# Patient Record
Sex: Male | Born: 1975 | Race: White | Hispanic: No | Marital: Married | Smoking: Never smoker
Health system: Southern US, Community
[De-identification: ages and names within clinical notes are randomized; demographics above are authoritative.]

## PROBLEM LIST (undated history)

## (undated) DIAGNOSIS — M5126 Other intervertebral disc displacement, lumbar region: Secondary | ICD-10-CM

## (undated) HISTORY — PX: HERNIA REPAIR: SHX51

## (undated) HISTORY — PX: VASECTOMY: SHX75

## (undated) HISTORY — PX: OTHER SURGICAL HISTORY: SHX169

---

## 2013-04-14 ENCOUNTER — Emergency Department (HOSPITAL_COMMUNITY): Payer: BC Managed Care – PPO

## 2013-04-14 ENCOUNTER — Encounter (HOSPITAL_COMMUNITY): Payer: Self-pay | Admitting: *Deleted

## 2013-04-14 ENCOUNTER — Emergency Department (HOSPITAL_COMMUNITY)
Admission: EM | Admit: 2013-04-14 | Discharge: 2013-04-14 | Disposition: A | Discharge status: Home / Self Care | Payer: BC Managed Care – PPO | Attending: Emergency Medicine | Admitting: Emergency Medicine

## 2013-04-14 DIAGNOSIS — R079 Chest pain, unspecified: Secondary | ICD-10-CM | POA: Insufficient documentation

## 2013-04-14 LAB — COMPREHENSIVE METABOLIC PANEL
Albumin: 4.2 g/dL (ref 3.5–5.2)
BUN: 21 mg/dL (ref 6–23)
Calcium: 9.7 mg/dL (ref 8.4–10.5)
Creatinine, Ser: 1.11 mg/dL (ref 0.50–1.35)
GFR calc Af Amer: >90 mL/min (ref >90)
Glucose, Bld: 101 mg/dL — ABNORMAL HIGH (ref 70–99)
Total Protein: 7.2 g/dL (ref 6.0–8.3)

## 2013-04-14 LAB — CBC WITH DIFFERENTIAL/PLATELET
Basophils Relative: 0 % (ref 0–1)
Eosinophils Absolute: 0.2 10*3/uL (ref 0.0–0.7)
Eosinophils Relative: 3 % (ref 0–5)
HCT: 48.3 % (ref 39.0–52.0)
Hemoglobin: 16.5 g/dL (ref 13.0–17.0)
MCH: 30.1 pg (ref 26.0–34.0)
MCHC: 34.2 g/dL (ref 30.0–36.0)
MCV: 88.1 fL (ref 78.0–100.0)
Monocytes Absolute: 0.5 10*3/uL (ref 0.1–1.0)
Monocytes Relative: 7 % (ref 3–12)
Neutrophils Relative %: 58 % (ref 43–77)

## 2013-04-14 LAB — TROPONIN I: Troponin I: <0.3 ng/mL (ref <0.30)

## 2013-04-14 MED ORDER — OMEPRAZOLE 20 MG PO CPDR: 20.0000 mg | DELAYED_RELEASE_CAPSULE | Freq: Every day | ORAL | Status: DC

## 2013-04-14 MED ORDER — ALUM & MAG HYDROXIDE-SIMETH 200-200-20 MG/5ML PO SUSP
30.0000 mL | Freq: Once | ORAL | Status: AC
  Administered 2013-04-14: 30 mL via ORAL
  Filled 2013-04-14: qty 30

## 2013-04-14 NOTE — ED Notes (Signed)
C/o chest tightness and left arm pain today; previous episode of light headedness and felt like couldn't breathe earlier this wk

## 2013-04-14 NOTE — ED Notes (Signed)
MD at bedside. 

## 2013-04-14 NOTE — ED Notes (Signed)
Patient states earlier this week he had an episode of lightheadedness and shortness of breath. Patient states today around 1400 he began experiencing light arm pain and later in the afternoon he began having chest tightness, described as feeling like he was being squeezed. Patient states today's events followed a larger than normal lunch of "a big greasy burger". 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 Patient A&Ox4, NAD at this time.

## 2013-04-14 NOTE — ED Notes (Signed)
Patient transported to X-ray 

## 2013-04-14 NOTE — ED Provider Notes (Addendum)
History     CSN: [id number]  Arrival date & time 04/14/13  2004   First MD Initiated Contact with Patient 04/14/13 2016      Chief Complaint  Patient presents with  . Chest Pain    HPI Patient reports developing pain in his left arm today shortly after eating a large hamburger.  The pain in his left arm was persistent since around 1:30 or 2 PM today has been present for the past 7 hours.  2 hours ago he developed a sense of heaviness in his chest that will not go away.  No associated diaphoresis nausea vomiting.  No shortness of breath.  He reports he had similar symptoms however it was more a sensation of pressure in his chest with shortness of breath several days ago when he was in [place] right after he beat and some fried food.  He denies abdominal pain.  Symptoms are mild in severity at this time.  No history of asthma.  No difficulty breathing.  No pleuritic nature of his pain.  He has no history of hypertension, diabetes, hyperlipidemia, or family history of early heart disease.  He does not smoke cigarettes   No past medical history on file.  Past Surgical History  Procedure Laterality Date  . Hernia repair    . Arm surgery      No family history on file.  History  Substance Use Topics  . Smoking status: Never Smoker   . Smokeless tobacco: Not on file  . Alcohol Use: No      Review of Systems  All other systems reviewed and are negative.    Allergies  Review of patient's allergies indicates no known allergies.  Home Medications   Current Outpatient Rx  Name  Route  Sig  Dispense  Refill  . ibuprofen (ADVIL,MOTRIN) 200 MG tablet   Oral   Take 400 mg by mouth every 6 (six) hours as needed for pain.           BP 127/82  Pulse 70  Temp(Src) 98.1 F (36.7 C)  Resp 20  Ht 6\' 2"  (1.88 m)  Wt 195 lb (88.451 kg)  BMI 25.03 kg/m2  SpO2 99%  Physical Exam  Nursing note and vitals reviewed. Constitutional: He is oriented to person, place, and time. He  appears well-developed and well-nourished.  HENT:  Head: Normocephalic and atraumatic.  Eyes: EOM are normal.  Neck: Normal range of motion.  Cardiovascular: Normal rate, regular rhythm, normal heart sounds and intact distal pulses.   Pulmonary/Chest: Effort normal and breath sounds normal. No respiratory distress.  Abdominal: Soft. He exhibits no distension. There is no tenderness.  Genitourinary: Rectum normal.  Musculoskeletal: Normal range of motion.  Neurological: He is alert and oriented to person, place, and time.  Skin: Skin is warm and dry.  Psychiatric: He has a normal mood and affect. Judgment normal.    ED Course  Procedures (including critical care time)   Date: 04/14/2013  Rate: 73  Rhythm: normal sinus rhythm  QRS Axis: normal  Intervals: normal  ST/T Wave abnormalities: normal  Conduction Disutrbances: none  Narrative Interpretation:   Old EKG Reviewed: no prior ecg     Labs Reviewed  COMPREHENSIVE METABOLIC PANEL - Abnormal; Notable for the following:    Glucose, Bld 101 (*)    GFR calc non Af Amer 85 (*)    All other components within normal limits  CBC WITH DIFFERENTIAL  TROPONIN I   Dg Chest 2  View  04/14/2013   *RADIOLOGY REPORT*  Clinical Data: Chest pain shortness of breath  CHEST - 2 VIEW  Comparison: None.  Findings: Normal heart, mediastinal, and hilar contours.  Cardiac leads project over the chest.  The lungs are well expanded and clear.  No pleural effusion or pneumothorax.  No acute osseous abnormality.  IMPRESSION: No acute cardiopulmonary disease.   Original Report Authenticated By: Britta Mccreedy, M.D.   I personally reviewed the imaging tests through PACS system I reviewed available ER/hospitalization records through the EMR   1. Chest pain       MDM  Suspect more GI related pain. Doubt ACS.  EKG and troponin are normal  9:35 PM Patient is feeling better after Maalox.  Discharge home on Prilosec.  He needs to develop a  relationship with her primary care physician.  He understands to return to the ER for new or worsening symptoms        Lyanne Co, MD 04/14/13 2135  Lyanne Co, MD 04/22/13 [phone]

## 2013-04-14 NOTE — Progress Notes (Signed)
   CARE MANAGEMENT ED NOTE 04/14/2013  Patient:  Mitchell Mccullough, Mitchell Mccullough   Account Number:  [id number]  Date Initiated:  04/14/2013  Documentation initiated by:  Radford Pax  Subjective/Objective Assessment:   Patient presents to ED with chest tightness and left arm pain     Subjective/Objective Assessment Detail:     Action/Plan:   Action/Plan Detail:   Anticipated DC Date:       Status Recommendation to Physician:   Result of Recommendation:    Other ED Services  Consult Working Plan    DC Planning Services  Other  PCP issues    Choice offered to / List presented to:            Status of service:  Completed, signed off  ED Comments:   ED Comments Detail:  Patient listed as not having a pcp.  EDCM spoke to patient who confirmed taht he does not have a pcp.  Instructed patient to call number on the back of his insurance card to find a doctor who is within network.  No further needs at thid time.

## 2014-09-13 IMAGING — CR DG CHEST 2V
2 series · 2 of 2 positions shown · non-contrast
Comparison: None.

CLINICAL DATA: Chest pain shortness of breath

CHEST - 2 VIEW

[w chest pa]
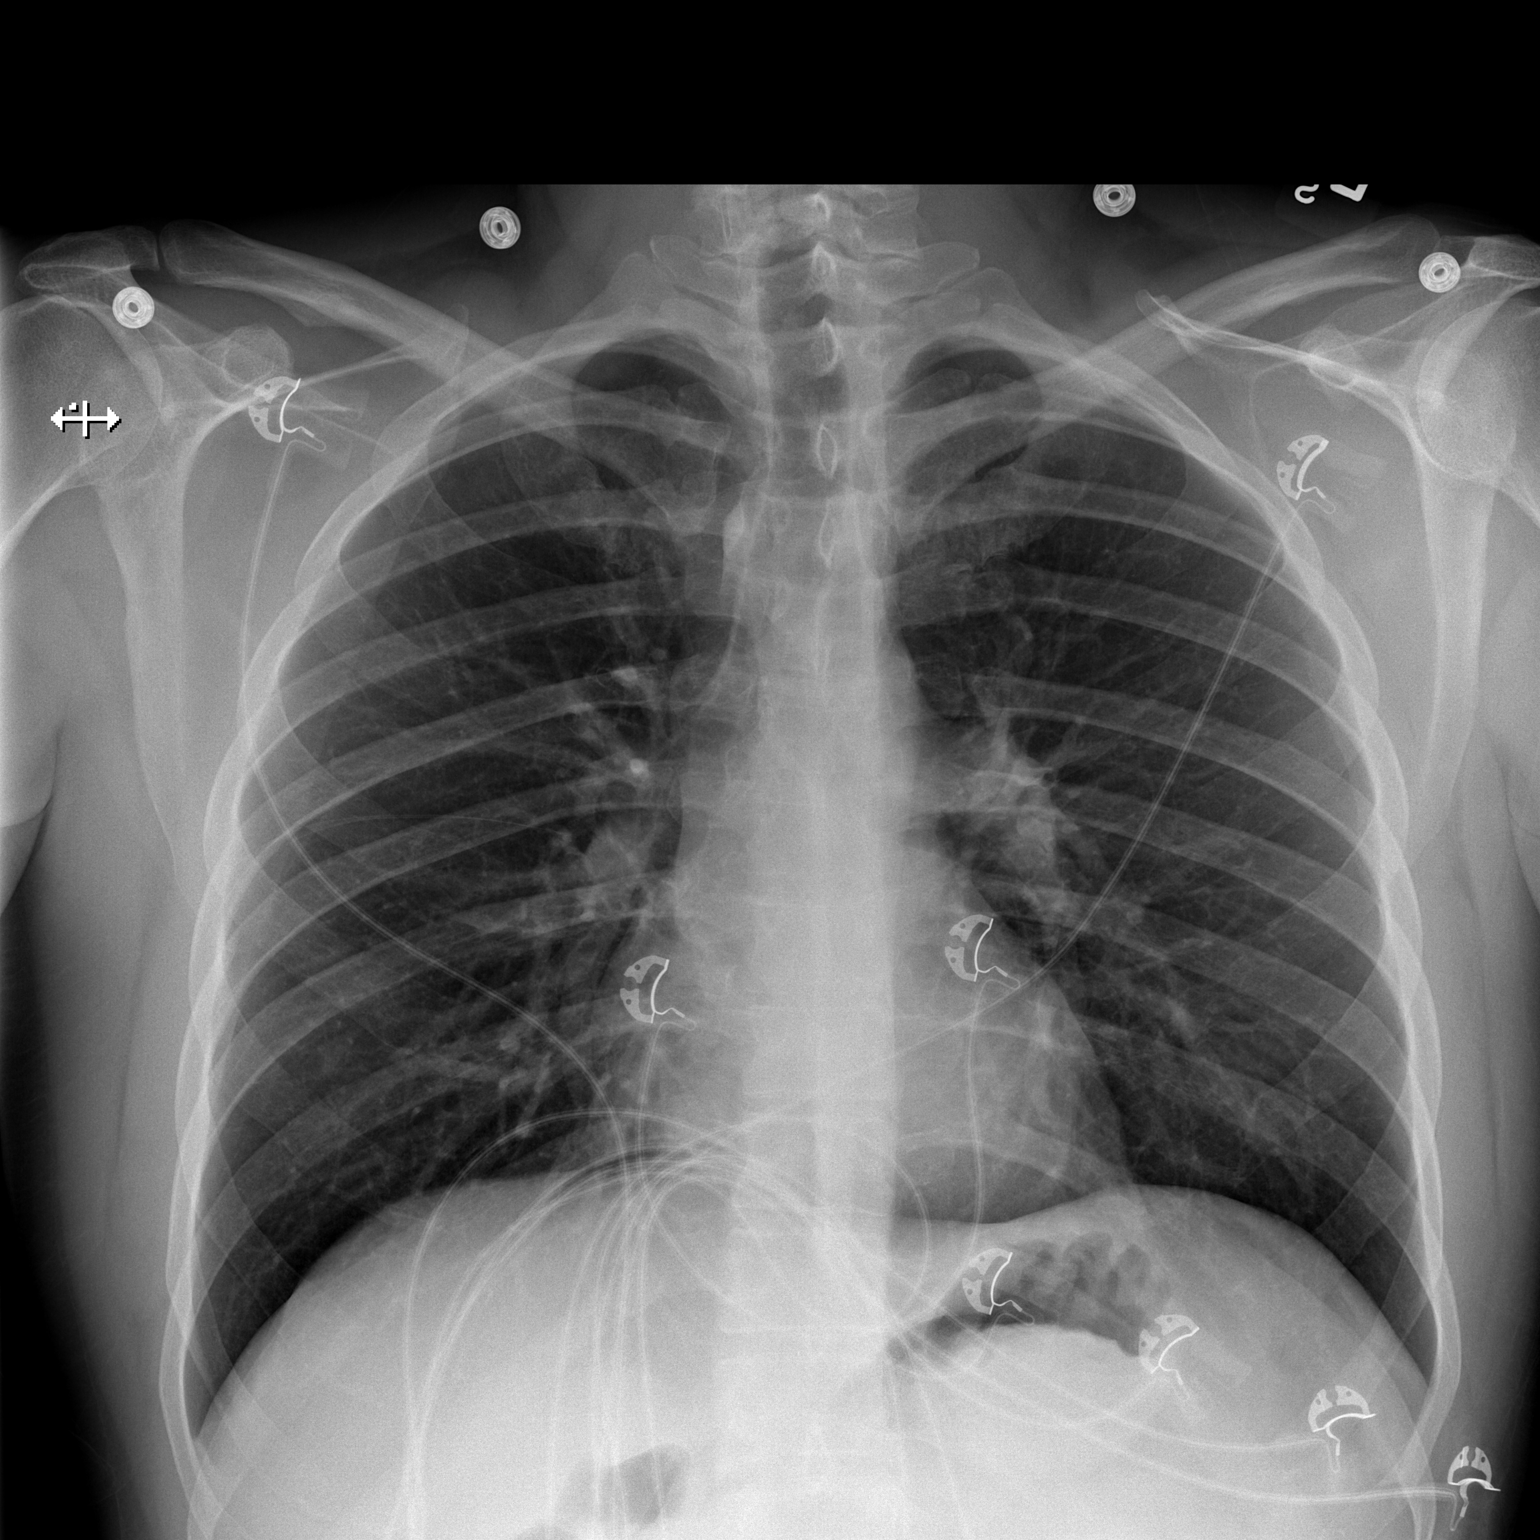

[w chest lat]
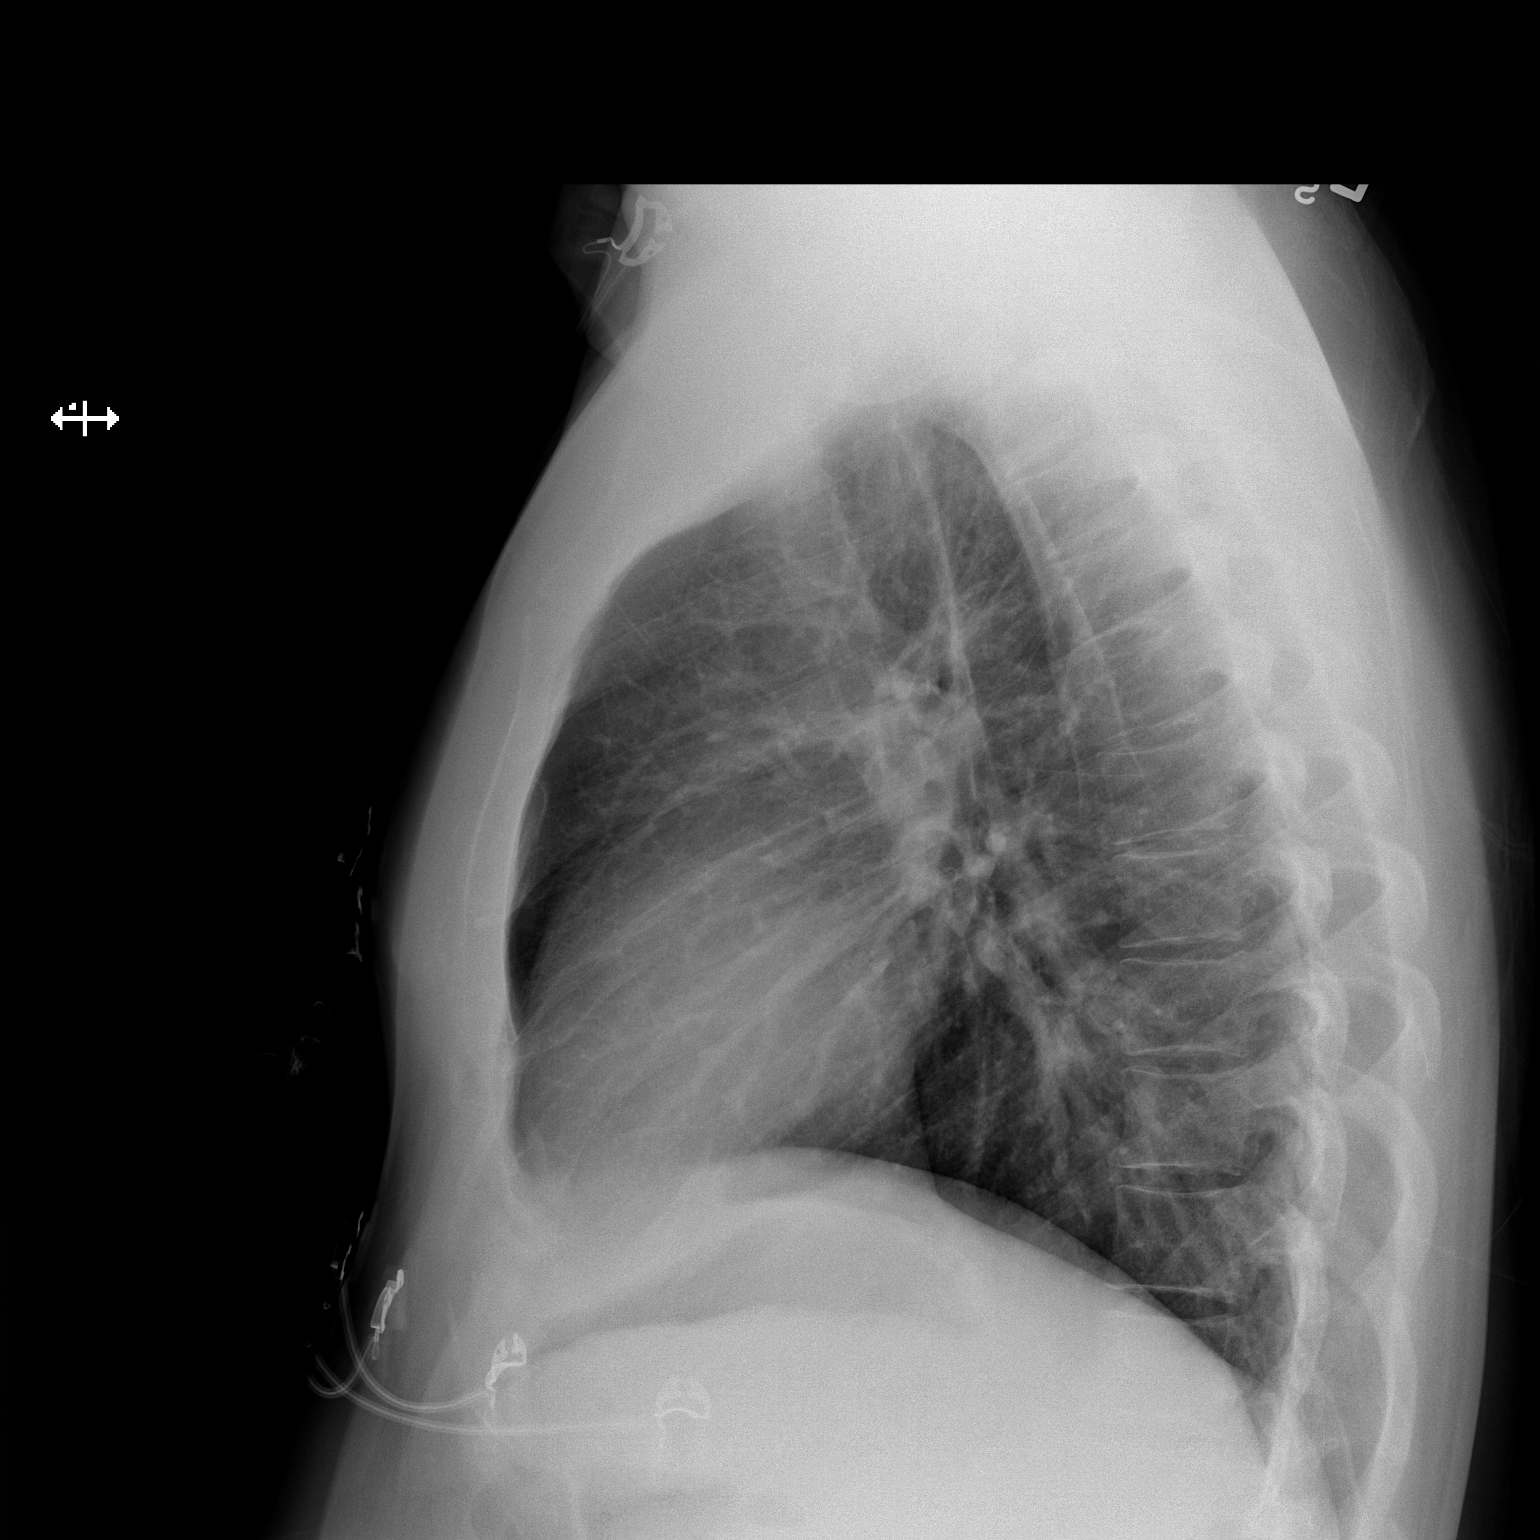

[2 of 2 positions shown; findings below may reference images not displayed]

FINDINGS: Normal heart, mediastinal, and hilar contours.  Cardiac
leads project over the chest.  The lungs are well expanded and
clear.  No pleural effusion or pneumothorax.  No acute osseous
abnormality.
IMPRESSION: No acute cardiopulmonary disease.

## 2017-07-14 ENCOUNTER — Ambulatory Visit: Payer: Self-pay | Admitting: Physician Assistant

## 2017-07-16 ENCOUNTER — Ambulatory Visit: Admit: 2017-07-16 | Payer: Self-pay | Admitting: Orthopedic Surgery

## 2017-07-16 SURGERY — LUMBAR LAMINECTOMY/DECOMPRESSION MICRODISCECTOMY 1 LEVEL
Anesthesia: General | Laterality: Left

## 2017-07-21 ENCOUNTER — Encounter (HOSPITAL_COMMUNITY): Payer: Self-pay | Admitting: *Deleted

## 2017-07-21 ENCOUNTER — Ambulatory Visit: Payer: Self-pay | Admitting: Physician Assistant

## 2017-07-21 NOTE — Progress Notes (Addendum)
Pt denies SOB, chest pain, and being under the care of a cardiologist. Pt denies having a stress test ,echo and cardiac cath. Pt denies having an EKG and chest x ray within the last year. Pt denies having recent labs. Pt made aware to stop taking  Aspirin, vitamins, fish oil, and herbal medications. Do not take any NSAIDs ie: Ibuprofen, Advil, Naproxen (Aleve), Motrin, BC and Goody Powder or any medication containing Aspirin. Pt verbalized understanding of all pre-op instructions.  Anesthesia made aware of order for consult.

## 2017-07-23 ENCOUNTER — Observation Stay (HOSPITAL_COMMUNITY)
Admission: RE | Admit: 2017-07-23 | Discharge: 2017-07-24 | Disposition: A | Discharge status: Home / Self Care | Payer: Self-pay | Source: Ambulatory Visit | Attending: Orthopedic Surgery | Admitting: Orthopedic Surgery

## 2017-07-23 ENCOUNTER — Ambulatory Visit (HOSPITAL_COMMUNITY): Payer: Self-pay | Admitting: Vascular Surgery

## 2017-07-23 ENCOUNTER — Ambulatory Visit (HOSPITAL_COMMUNITY)
Admission: RE | Disposition: A | Discharge status: Home / Self Care | Payer: Self-pay | Source: Ambulatory Visit | Attending: Orthopedic Surgery

## 2017-07-23 ENCOUNTER — Ambulatory Visit (HOSPITAL_COMMUNITY): Payer: Self-pay

## 2017-07-23 ENCOUNTER — Encounter (HOSPITAL_COMMUNITY): Payer: Self-pay | Admitting: Certified Registered Nurse Anesthetist

## 2017-07-23 DIAGNOSIS — G8929 Other chronic pain: Secondary | ICD-10-CM | POA: Insufficient documentation

## 2017-07-23 DIAGNOSIS — Z8249 Family history of ischemic heart disease and other diseases of the circulatory system: Secondary | ICD-10-CM | POA: Insufficient documentation

## 2017-07-23 DIAGNOSIS — M5137 Other intervertebral disc degeneration, lumbosacral region: Secondary | ICD-10-CM | POA: Insufficient documentation

## 2017-07-23 DIAGNOSIS — Z9889 Other specified postprocedural states: Secondary | ICD-10-CM

## 2017-07-23 DIAGNOSIS — M5126 Other intervertebral disc displacement, lumbar region: Secondary | ICD-10-CM | POA: Insufficient documentation

## 2017-07-23 DIAGNOSIS — M5417 Radiculopathy, lumbosacral region: Secondary | ICD-10-CM | POA: Insufficient documentation

## 2017-07-23 DIAGNOSIS — Z833 Family history of diabetes mellitus: Secondary | ICD-10-CM | POA: Insufficient documentation

## 2017-07-23 DIAGNOSIS — M5127 Other intervertebral disc displacement, lumbosacral region: Principal | ICD-10-CM | POA: Insufficient documentation

## 2017-07-23 DIAGNOSIS — Z79899 Other long term (current) drug therapy: Secondary | ICD-10-CM | POA: Insufficient documentation

## 2017-07-23 DIAGNOSIS — Z9852 Vasectomy status: Secondary | ICD-10-CM | POA: Insufficient documentation

## 2017-07-23 DIAGNOSIS — Z419 Encounter for procedure for purposes other than remedying health state, unspecified: Secondary | ICD-10-CM

## 2017-07-23 HISTORY — DX: Other intervertebral disc displacement, lumbar region: M51.26

## 2017-07-23 HISTORY — PX: LUMBAR LAMINECTOMY/DECOMPRESSION MICRODISCECTOMY: SHX5026

## 2017-07-23 LAB — CBC
HCT: 47.1 % (ref 39.0–52.0)
Hemoglobin: 16.2 g/dL (ref 13.0–17.0)
MCH: 30.9 pg (ref 26.0–34.0)
MCHC: 34.4 g/dL (ref 30.0–36.0)
MCV: 89.9 fL (ref 78.0–100.0)
PLATELETS: 175 10*3/uL (ref 150–400)
RBC: 5.24 MIL/uL (ref 4.22–5.81)
RDW: 12.7 % (ref 11.5–15.5)
WBC: 6.6 10*3/uL (ref 4.0–10.5)

## 2017-07-23 SURGERY — LUMBAR LAMINECTOMY/DECOMPRESSION MICRODISCECTOMY 1 LEVEL
Anesthesia: General | Site: Spine Lumbar | Laterality: Left

## 2017-07-23 MED ORDER — MENTHOL 3 MG MT LOZG: 1.0000 | LOZENGE | OROMUCOSAL | Status: DC | PRN

## 2017-07-23 MED ORDER — LACTATED RINGERS IV SOLN
INTRAVENOUS | Status: DC
  Administered 2017-07-23 (×3): via INTRAVENOUS

## 2017-07-23 MED ORDER — ACETAMINOPHEN 10 MG/ML IV SOLN
INTRAVENOUS | Status: AC
  Filled 2017-07-23: qty 100

## 2017-07-23 MED ORDER — PHENOL 1.4 % MT LIQD: 1.0000 | OROMUCOSAL | Status: DC | PRN

## 2017-07-23 MED ORDER — ROCURONIUM BROMIDE 100 MG/10ML IV SOLN
INTRAVENOUS | Status: DC | PRN
  Administered 2017-07-23 (×2): 10 mg via INTRAVENOUS
  Administered 2017-07-23: 50 mg via INTRAVENOUS

## 2017-07-23 MED ORDER — FENTANYL CITRATE (PF) 250 MCG/5ML IJ SOLN
INTRAMUSCULAR | Status: AC
  Filled 2017-07-23: qty 5

## 2017-07-23 MED ORDER — SODIUM CHLORIDE 0.9% FLUSH: 3.0000 mL | INTRAVENOUS | Status: DC | PRN

## 2017-07-23 MED ORDER — SUGAMMADEX SODIUM 200 MG/2ML IV SOLN
INTRAVENOUS | Status: DC | PRN
  Administered 2017-07-23: 200 mg via INTRAVENOUS

## 2017-07-23 MED ORDER — PHENYLEPHRINE 40 MCG/ML (10ML) SYRINGE FOR IV PUSH (FOR BLOOD PRESSURE SUPPORT)
PREFILLED_SYRINGE | INTRAVENOUS | Status: AC
  Filled 2017-07-23: qty 30

## 2017-07-23 MED ORDER — FENTANYL CITRATE (PF) 100 MCG/2ML IJ SOLN
INTRAMUSCULAR | Status: DC | PRN
  Administered 2017-07-23: 100 ug via INTRAVENOUS
  Administered 2017-07-23: 150 ug via INTRAVENOUS
  Administered 2017-07-23: 100 ug via INTRAVENOUS
  Administered 2017-07-23: 50 ug via INTRAVENOUS

## 2017-07-23 MED ORDER — THROMBIN 20000 UNITS EX SOLR
CUTANEOUS | Status: AC
  Filled 2017-07-23: qty 20000

## 2017-07-23 MED ORDER — 0.9 % SODIUM CHLORIDE (POUR BTL) OPTIME
TOPICAL | Status: DC | PRN
  Administered 2017-07-23 (×2): 1000 mL

## 2017-07-23 MED ORDER — LIDOCAINE HCL (CARDIAC) 20 MG/ML IV SOLN
INTRAVENOUS | Status: DC | PRN
  Administered 2017-07-23: 100 mg via INTRAVENOUS

## 2017-07-23 MED ORDER — OXYCODONE HCL 5 MG PO TABS
5.0000 mg | ORAL_TABLET | ORAL | Status: DC | PRN
  Administered 2017-07-23 – 2017-07-24 (×2): 10 mg via ORAL
  Filled 2017-07-23 (×3): qty 2

## 2017-07-23 MED ORDER — BUPIVACAINE-EPINEPHRINE 0.25% -1:200000 IJ SOLN
INTRAMUSCULAR | Status: DC | PRN
  Administered 2017-07-23: 10 mL

## 2017-07-23 MED ORDER — DEXAMETHASONE SODIUM PHOSPHATE 10 MG/ML IJ SOLN
INTRAMUSCULAR | Status: AC
  Filled 2017-07-23: qty 1

## 2017-07-23 MED ORDER — DEXTROSE 5 % IV SOLN
500.0000 mg | Freq: Four times a day (QID) | INTRAVENOUS | Status: DC | PRN
  Filled 2017-07-23: qty 5

## 2017-07-23 MED ORDER — FENTANYL CITRATE (PF) 100 MCG/2ML IJ SOLN
INTRAMUSCULAR | Status: AC
  Filled 2017-07-23: qty 2

## 2017-07-23 MED ORDER — ONDANSETRON HCL 4 MG/2ML IJ SOLN
INTRAMUSCULAR | Status: AC
  Filled 2017-07-23: qty 2

## 2017-07-23 MED ORDER — MIDAZOLAM HCL 2 MG/2ML IJ SOLN
INTRAMUSCULAR | Status: AC
  Filled 2017-07-23: qty 2

## 2017-07-23 MED ORDER — METHOCARBAMOL 500 MG PO TABS
ORAL_TABLET | ORAL | Status: AC
  Filled 2017-07-23: qty 1

## 2017-07-23 MED ORDER — MEPERIDINE HCL 25 MG/ML IJ SOLN: 6.2500 mg | INTRAMUSCULAR | Status: DC | PRN

## 2017-07-23 MED ORDER — OXYCODONE-ACETAMINOPHEN 10-325 MG PO TABS: 1.0000 | ORAL_TABLET | ORAL | 0 refills | Status: AC | PRN

## 2017-07-23 MED ORDER — HYDROXYZINE HCL 50 MG/ML IM SOLN
50.0000 mg | Freq: Four times a day (QID) | INTRAMUSCULAR | Status: DC | PRN
  Filled 2017-07-23: qty 1

## 2017-07-23 MED ORDER — BUPIVACAINE-EPINEPHRINE (PF) 0.25% -1:200000 IJ SOLN
INTRAMUSCULAR | Status: AC
  Filled 2017-07-23: qty 30

## 2017-07-23 MED ORDER — SUGAMMADEX SODIUM 500 MG/5ML IV SOLN
INTRAVENOUS | Status: AC
  Filled 2017-07-23: qty 5

## 2017-07-23 MED ORDER — ACETAMINOPHEN 10 MG/ML IV SOLN
INTRAVENOUS | Status: DC | PRN
  Administered 2017-07-23: 1000 mg via INTRAVENOUS

## 2017-07-23 MED ORDER — MIDAZOLAM HCL 5 MG/5ML IJ SOLN
INTRAMUSCULAR | Status: DC | PRN
  Administered 2017-07-23: 2 mg via INTRAVENOUS

## 2017-07-23 MED ORDER — FENTANYL CITRATE (PF) 100 MCG/2ML IJ SOLN
25.0000 ug | INTRAMUSCULAR | Status: DC | PRN
  Administered 2017-07-23 (×3): 50 ug via INTRAVENOUS

## 2017-07-23 MED ORDER — ONDANSETRON HCL 4 MG/2ML IJ SOLN: 4.0000 mg | Freq: Four times a day (QID) | INTRAMUSCULAR | Status: DC | PRN

## 2017-07-23 MED ORDER — ONDANSETRON 4 MG PO TBDP: 4.0000 mg | ORAL_TABLET | Freq: Three times a day (TID) | ORAL | 0 refills | Status: AC | PRN

## 2017-07-23 MED ORDER — ONDANSETRON HCL 4 MG/2ML IJ SOLN
INTRAMUSCULAR | Status: DC | PRN
  Administered 2017-07-23: 4 mg via INTRAVENOUS

## 2017-07-23 MED ORDER — ONDANSETRON HCL 4 MG PO TABS: 4.0000 mg | ORAL_TABLET | Freq: Four times a day (QID) | ORAL | Status: DC | PRN

## 2017-07-23 MED ORDER — DOCUSATE SODIUM 100 MG PO CAPS
100.0000 mg | ORAL_CAPSULE | Freq: Two times a day (BID) | ORAL | Status: DC
Start: 2017-07-23 — End: 2017-07-24
  Administered 2017-07-23 – 2017-07-24 (×2): 100 mg via ORAL
  Filled 2017-07-23 (×2): qty 1

## 2017-07-23 MED ORDER — THROMBIN 20000 UNITS EX KIT
PACK | CUTANEOUS | Status: DC | PRN
  Administered 2017-07-23: 20 mL via TOPICAL

## 2017-07-23 MED ORDER — METHOCARBAMOL 500 MG PO TABS: 500.0000 mg | ORAL_TABLET | Freq: Three times a day (TID) | ORAL | 0 refills | Status: AC

## 2017-07-23 MED ORDER — ACETAMINOPHEN 325 MG PO TABS
650.0000 mg | ORAL_TABLET | ORAL | Status: DC | PRN
  Filled 2017-07-23: qty 2

## 2017-07-23 MED ORDER — HEMOSTATIC AGENTS (NO CHARGE) OPTIME
TOPICAL | Status: DC | PRN
  Administered 2017-07-23 (×2): 1 via TOPICAL

## 2017-07-23 MED ORDER — LACTATED RINGERS IV SOLN: INTRAVENOUS | Status: DC

## 2017-07-23 MED ORDER — FENTANYL CITRATE (PF) 100 MCG/2ML IJ SOLN
INTRAMUSCULAR | Status: AC
  Administered 2017-07-23: 50 ug via INTRAVENOUS
  Filled 2017-07-23: qty 2

## 2017-07-23 MED ORDER — DEXAMETHASONE SODIUM PHOSPHATE 4 MG/ML IJ SOLN
INTRAMUSCULAR | Status: DC | PRN
  Administered 2017-07-23: 10 mg via INTRAVENOUS

## 2017-07-23 MED ORDER — POLYETHYLENE GLYCOL 3350 17 G PO PACK
17.0000 g | PACK | Freq: Every day | ORAL | Status: DC | PRN
  Filled 2017-07-23: qty 1

## 2017-07-23 MED ORDER — ONDANSETRON HCL 4 MG/2ML IJ SOLN: 4.0000 mg | Freq: Once | INTRAMUSCULAR | Status: DC | PRN

## 2017-07-23 MED ORDER — METHYLPREDNISOLONE ACETATE 40 MG/ML IJ SUSP
INTRAMUSCULAR | Status: AC
  Filled 2017-07-23: qty 1

## 2017-07-23 MED ORDER — SODIUM CHLORIDE 0.9% FLUSH: 3.0000 mL | Freq: Two times a day (BID) | INTRAVENOUS | Status: DC

## 2017-07-23 MED ORDER — CEFAZOLIN SODIUM-DEXTROSE 2-4 GM/100ML-% IV SOLN
2.0000 g | INTRAVENOUS | Status: AC
  Administered 2017-07-23: 2 g via INTRAVENOUS

## 2017-07-23 MED ORDER — PROPOFOL 10 MG/ML IV BOLUS
INTRAVENOUS | Status: AC
  Filled 2017-07-23: qty 20

## 2017-07-23 MED ORDER — MAGNESIUM CITRATE PO SOLN: 1.0000 | Freq: Once | ORAL | Status: DC | PRN

## 2017-07-23 MED ORDER — CEFAZOLIN SODIUM-DEXTROSE 2-4 GM/100ML-% IV SOLN
2.0000 g | Freq: Three times a day (TID) | INTRAVENOUS | Status: AC
  Administered 2017-07-23 – 2017-07-24 (×2): 2 g via INTRAVENOUS
  Filled 2017-07-23 (×2): qty 100

## 2017-07-23 MED ORDER — METHYLPREDNISOLONE ACETATE 40 MG/ML IJ SUSP
INTRAMUSCULAR | Status: DC | PRN
  Administered 2017-07-23: 40 mg

## 2017-07-23 MED ORDER — LIDOCAINE 2% (20 MG/ML) 5 ML SYRINGE
INTRAMUSCULAR | Status: AC
  Filled 2017-07-23: qty 5

## 2017-07-23 MED ORDER — ACETAMINOPHEN 650 MG RE SUPP: 650.0000 mg | RECTAL | Status: DC | PRN

## 2017-07-23 MED ORDER — OXYCODONE HCL 5 MG PO TABS
ORAL_TABLET | ORAL | Status: AC
  Filled 2017-07-23: qty 2

## 2017-07-23 MED ORDER — GABAPENTIN 300 MG PO CAPS
300.0000 mg | ORAL_CAPSULE | Freq: Three times a day (TID) | ORAL | Status: DC | PRN
Start: 2017-07-23 — End: 2017-07-24

## 2017-07-23 MED ORDER — PHENYLEPHRINE 40 MCG/ML (10ML) SYRINGE FOR IV PUSH (FOR BLOOD PRESSURE SUPPORT)
PREFILLED_SYRINGE | INTRAVENOUS | Status: AC
  Filled 2017-07-23: qty 10

## 2017-07-23 MED ORDER — MORPHINE SULFATE (PF) 4 MG/ML IV SOLN: 2.0000 mg | INTRAVENOUS | Status: DC | PRN

## 2017-07-23 MED ORDER — CEFAZOLIN SODIUM-DEXTROSE 2-4 GM/100ML-% IV SOLN
INTRAVENOUS | Status: AC
  Filled 2017-07-23: qty 100

## 2017-07-23 MED ORDER — ROCURONIUM BROMIDE 10 MG/ML (PF) SYRINGE
PREFILLED_SYRINGE | INTRAVENOUS | Status: AC
  Filled 2017-07-23: qty 5

## 2017-07-23 MED ORDER — METHOCARBAMOL 500 MG PO TABS
500.0000 mg | ORAL_TABLET | Freq: Four times a day (QID) | ORAL | Status: DC | PRN
  Administered 2017-07-23 – 2017-07-24 (×3): 500 mg via ORAL
  Filled 2017-07-23 (×4): qty 1

## 2017-07-23 MED ORDER — PROPOFOL 10 MG/ML IV BOLUS
INTRAVENOUS | Status: DC | PRN
  Administered 2017-07-23: 200 mg via INTRAVENOUS

## 2017-07-23 SURGICAL SUPPLY — 71 items
BNDG GAUZE ELAST 4 BULKY (GAUZE/BANDAGES/DRESSINGS) ×3 IMPLANT
BUR EGG ELITE 4.0 (BURR) IMPLANT
BUR EGG ELITE 4.0MM (BURR)
BUR MATCHSTICK NEURO 3.0 LAGG (BURR) IMPLANT
CANISTER SUCT 3000ML PPV (MISCELLANEOUS) ×3 IMPLANT
CLOSURE STERI-STRIP 1/2X4 (GAUZE/BANDAGES/DRESSINGS) ×1
CLSR STERI-STRIP ANTIMIC 1/2X4 (GAUZE/BANDAGES/DRESSINGS) ×2 IMPLANT
CORD BI POLAR (MISCELLANEOUS) ×3 IMPLANT
COVER SURGICAL LIGHT HANDLE (MISCELLANEOUS) ×3 IMPLANT
DRAIN CHANNEL 15F RND FF W/TCR (WOUND CARE) IMPLANT
DRAPE POUCH INSTRU U-SHP 10X18 (DRAPES) ×3 IMPLANT
DRAPE SURG 17X23 STRL (DRAPES) ×3 IMPLANT
DRAPE U-SHAPE 47X51 STRL (DRAPES) ×3 IMPLANT
DRSG AQUACEL AG ADV 3.5X 4 (GAUZE/BANDAGES/DRESSINGS) IMPLANT
DRSG AQUACEL AG ADV 3.5X 6 (GAUZE/BANDAGES/DRESSINGS) IMPLANT
DRSG OPSITE POSTOP 4X6 (GAUZE/BANDAGES/DRESSINGS) ×3 IMPLANT
DURAPREP 26ML APPLICATOR (WOUND CARE) ×3 IMPLANT
ELECT BLADE 4.0 EZ CLEAN MEGAD (MISCELLANEOUS) ×3
ELECT CAUTERY BLADE 6.4 (BLADE) ×3 IMPLANT
ELECT PENCIL ROCKER SW 15FT (MISCELLANEOUS) ×3 IMPLANT
ELECT REM PT RETURN 9FT ADLT (ELECTROSURGICAL) ×3
ELECTRODE BLDE 4.0 EZ CLN MEGD (MISCELLANEOUS) ×1 IMPLANT
ELECTRODE REM PT RTRN 9FT ADLT (ELECTROSURGICAL) ×1 IMPLANT
EVACUATOR SILICONE 100CC (DRAIN) IMPLANT
GLOVE BIO SURGEON STRL SZ 6.5 (GLOVE) ×2 IMPLANT
GLOVE BIO SURGEONS STRL SZ 6.5 (GLOVE) ×1
GLOVE BIOGEL PI IND STRL 6.5 (GLOVE) ×1 IMPLANT
GLOVE BIOGEL PI IND STRL 8.5 (GLOVE) ×1 IMPLANT
GLOVE BIOGEL PI INDICATOR 6.5 (GLOVE) ×2
GLOVE BIOGEL PI INDICATOR 8.5 (GLOVE) ×2
GLOVE SS BIOGEL STRL SZ 8.5 (GLOVE) ×1 IMPLANT
GLOVE SUPERSENSE BIOGEL SZ 8.5 (GLOVE) ×2
GOWN STRL REUS W/TWL 2XL LVL3 (GOWN DISPOSABLE) ×6 IMPLANT
KIT BASIN OR (CUSTOM PROCEDURE TRAY) ×3 IMPLANT
KIT ROOM TURNOVER OR (KITS) ×3 IMPLANT
NEEDLE 18GX1X1/2 (RX/OR ONLY) (NEEDLE) IMPLANT
NEEDLE 22X1 1/2 (OR ONLY) (NEEDLE) ×3 IMPLANT
NEEDLE SPNL 18GX3.5 QUINCKE PK (NEEDLE) ×6 IMPLANT
NS IRRIG 1000ML POUR BTL (IV SOLUTION) ×6 IMPLANT
PACK LAMINECTOMY ORTHO (CUSTOM PROCEDURE TRAY) ×3 IMPLANT
PACK UNIVERSAL I (CUSTOM PROCEDURE TRAY) ×3 IMPLANT
PAD ARMBOARD 7.5X6 YLW CONV (MISCELLANEOUS) ×9 IMPLANT
PATTIES SURGICAL .5 X.5 (GAUZE/BANDAGES/DRESSINGS) IMPLANT
PATTIES SURGICAL .5 X1 (DISPOSABLE) ×3 IMPLANT
SPOGE SURGIFLO 8M (HEMOSTASIS) ×4
SPONGE SURGIFLO 8M (HEMOSTASIS) ×2 IMPLANT
SPONGE SURGIFOAM ABS GEL 100 (HEMOSTASIS) ×3 IMPLANT
SURGIFLO W/THROMBIN 8M KIT (HEMOSTASIS) IMPLANT
SUT BONE WAX W31G (SUTURE) ×3 IMPLANT
SUT MON AB 3-0 SH 27 (SUTURE)
SUT MON AB 3-0 SH27 (SUTURE) IMPLANT
SUT STRATAFIX 1PDS 45CM VIOLET (SUTURE) IMPLANT
SUT STRATAFIX MNCRL+ 3-0 PS-2 (SUTURE)
SUT STRATAFIX MONOCRYL 3-0 (SUTURE)
SUT STRATAFIX SPIRAL + 2-0 (SUTURE) IMPLANT
SUT VIC AB 0 CT1 27 (SUTURE)
SUT VIC AB 0 CT1 27XBRD ANBCTR (SUTURE) IMPLANT
SUT VIC AB 1 CT1 18XCR BRD 8 (SUTURE) ×1 IMPLANT
SUT VIC AB 1 CT1 8-18 (SUTURE) ×2
SUT VIC AB 1 CTX 36 (SUTURE)
SUT VIC AB 1 CTX36XBRD ANBCTR (SUTURE) IMPLANT
SUT VIC AB 2-0 CT1 18 (SUTURE) ×6 IMPLANT
SUTURE STRATFX MNCRL+ 3-0 PS-2 (SUTURE) IMPLANT
SYR 3ML LL SCALE MARK (SYRINGE) IMPLANT
SYR BULB IRRIGATION 50ML (SYRINGE) ×3 IMPLANT
SYR CONTROL 10ML LL (SYRINGE) ×3 IMPLANT
SYR TB 1ML LUER SLIP (SYRINGE) ×3 IMPLANT
TOWEL OR 17X24 6PK STRL BLUE (TOWEL DISPOSABLE) IMPLANT
TOWEL OR 17X26 10 PK STRL BLUE (TOWEL DISPOSABLE) IMPLANT
WATER STERILE IRR 1000ML POUR (IV SOLUTION) IMPLANT
YANKAUER SUCT BULB TIP NO VENT (SUCTIONS) ×3 IMPLANT

## 2017-07-23 NOTE — Anesthesia Postprocedure Evaluation (Signed)
Anesthesia Post Note  Patient: Mitchell Mccullough  Procedure(s) Performed: Procedure(s) (LRB): Microdisectomy left Lumbar 5 - Sacral 1 (Left)     Patient location during evaluation: PACU Anesthesia Type: General Level of consciousness: awake and alert Pain management: pain level controlled Vital Signs Assessment: post-procedure vital signs reviewed and stable Respiratory status: spontaneous breathing, nonlabored ventilation, respiratory function stable and patient connected to nasal cannula oxygen Cardiovascular status: blood pressure returned to baseline and stable Postop Assessment: no apparent nausea or vomiting Anesthetic complications: no    Last Vitals:  Vitals:   07/23/17 1420 07/23/17 1421  BP: 121/77   Pulse: 100   Resp:    Temp:  37.9 C  SpO2: 98%     Last Pain:  Vitals:   07/23/17 1421  TempSrc:   PainSc: 0-No pain                 Ngozi Alvidrez

## 2017-07-23 NOTE — Anesthesia Procedure Notes (Signed)
Procedure Name: Intubation Date/Time: 07/23/2017 11:44 AM Performed by: Reine Just Pre-anesthesia Checklist: Patient identified, Emergency Drugs available, Suction available and Patient being monitored Patient Re-evaluated:Patient Re-evaluated prior to induction Oxygen Delivery Method: Circle system utilized Preoxygenation: Pre-oxygenation with 100% oxygen Induction Type: IV induction Ventilation: Mask ventilation without difficulty Laryngoscope Size: Miller and 3 Grade View: Grade I Tube type: Oral Tube size: 7.5 mm Number of attempts: 1 Airway Equipment and Method: Patient positioned with wedge pillow and Stylet Placement Confirmation: ETT inserted through vocal cords under direct vision,  positive ETCO2 and breath sounds checked- equal and bilateral Secured at: 22 cm Tube secured with: Tape Dental Injury: Teeth and Oropharynx as per pre-operative assessment

## 2017-07-23 NOTE — Discharge Instructions (Signed)
Laminectomy, Care After °This sheet gives you information about how to care for yourself after your procedure. Your health care provider may also give you more specific instructions. If you have problems or questions, contact your health care provider. °What can I expect after the procedure? °After the procedure, it is common to have: °· Some pain around your incision area. °· Muscle tightening (spasms) across the back. ° °Follow these instructions at home: °Incision care °· Follow instructions from your health care provider about how to take care of your incision area. Make sure you: °? Wash your hands with soap and water before and after you apply medicine to the area or change your bandage (dressing). If soap and water are not available, use hand sanitizer. °? Change your dressing as told by your health care provider. °? Leave stitches (sutures), skin glue, or adhesive strips in place. These skin closures may need to stay in place for 2 weeks or longer. If adhesive strip edges start to loosen and curl up, you may trim the loose edges. Do not remove adhesive strips completely unless your health care provider tells you to do that. °· Check your incision area every day for signs of infection. Check for: °? More redness, swelling, or pain. °? More fluid or blood. °? Warmth. °? Pus or a bad smell. °Medicines °· Take over-the-counter and prescription medicines only as told by your health care provider. °· If you were prescribed an antibiotic medicine, use it as told by your health care provider. Do not stop using the antibiotic even if you start to feel better. °Bathing °· Do not take baths, swim, or use a hot tub for 2 weeks, or until your incision has healed completely. °· If your health care provider approves, you may take showers after your dressing has been removed. °Activity °· Return to your normal activities as told by your health care provider. Ask your health care provider what activities are safe for  you. °· Avoid bending or twisting at your waist. Always bend at your knees. °· Do not sit for more than 20-30 minutes at a time. Lie down or walk between periods of sitting. °· Do not lift anything that is heavier than 10 lb (4.5 kg) or the limit that your health care provider tells you, until he or she says that it is safe. °· Do not drive for 2 weeks after your procedure or for as long as your health care provider tells you. °· Do not drive or use heavy machinery while taking prescription pain medicine. °General instructions °· To prevent or treat constipation while you are taking prescription pain medicine, your health care provider may recommend that you: °? Drink enough fluid to keep your urine clear or pale yellow. °? Take over-the-counter or prescription medicines. °? Eat foods that are high in fiber, such as fresh fruits and vegetables, whole grains, and beans. °? Limit foods that are high in fat and processed sugars, such as fried and sweet foods. °· Do breathing exercises as told. °· Keep all follow-up visits as told by your health care provider. This is important. °Contact a health care provider if: °· You have more redness, swelling, or pain around your incision area. °· Your incision feels warm to the touch. °· You are not able to return to activities or do exercises as told by your health care provider. °Get help right away if: °· You have: °? More fluid or blood coming from your incision area. °? Pus or   a bad smell coming from your incision area. °? Chills or a fever. °? Episodes of dizziness or fainting while standing. °· You develop a rash. °· You develop shortness of breath or you have difficulty breathing. °· You cannot control when you urinate or have a bowel movement. °· You become weak. °· You are not able to use your legs. °Summary °· After the procedure, it is common to have some pain around your incision area. You may also have muscle tightening (spasms) across the back. °· Follow  instructions from your health care provider about how to care for your incision. °· Do not lift anything that is heavier than 10 lb (4.5 kg) or the limit that your health care provider tells you, until he or she says that it is safe. °· Contact your health care provider if you have more redness, swelling, or pain around your incision area or if your incision feels warm to the touch. These can be signs of infection. °This information is not intended to replace advice given to you by your health care provider. Make sure you discuss any questions you have with your health care provider. °Document Released: 05/02/2005 Document Revised: 05/29/2016 Document Reviewed: 03/30/2016 °Elsevier Interactive Patient Education © 2018 [employer] ° °

## 2017-07-23 NOTE — Transfer of Care (Signed)
Immediate Anesthesia Transfer of Care Note  Patient: Mitchell Mccullough  Procedure(s) Performed: Procedure(s) with comments: Microdisectomy left Lumbar 5 - Sacral 1 (Left) - 120 mins  Patient Location: PACU  Anesthesia Type:General  Level of Consciousness: awake, alert  and oriented  Airway & Oxygen Therapy: Patient Spontanous Breathing and Patient connected to nasal cannula oxygen  Post-op Assessment: Report given to RN and Post -op Vital signs reviewed and stable  Post vital signs: Reviewed and stable  Last Vitals:  Vitals:   07/23/17 1055 07/23/17 1421  BP: (!) 158/86   Pulse: 77   Resp: 20   Temp: 36.7 C (P) 37.9 C  SpO2: 98%     Last Pain:  Vitals:   07/23/17 1421  TempSrc:   PainSc: (P) 0-No pain      Patients Stated Pain Goal: 4 (07/23/17 1056)  Complications: No apparent anesthesia complications

## 2017-07-23 NOTE — Brief Op Note (Signed)
07/23/2017  2:01 PM  PATIENT:  Mitchell Mccullough  41 y.o. male  PRE-OPERATIVE DIAGNOSIS:  L5-S1 Left disec herniation  POST-OPERATIVE DIAGNOSIS:  L5-S1 Left disec herniation  PROCEDURE:  Procedure(s) with comments: Microdisectomy left Lumbar 5 - Sacral 1 (Left) - 120 mins  SURGEON:  Surgeon(s) and Role:    Venita Lick, MD - Primary  PHYSICIAN ASSISTANT:   ASSISTANTS: Carmen Mayo   ANESTHESIA:   general  EBL:  Total I/O In: 1000 [I.V.:1000] Out: -   BLOOD ADMINISTERED:none  DRAINS: none   LOCAL MEDICATIONS USED:  MARCAINE, depo-medrol  SPECIMEN:  No Specimen  DISPOSITION OF SPECIMEN:  N/A  COUNTS:  YES  TOURNIQUET:  * No tourniquets in log *  DICTATION: .Dragon Dictation  PLAN OF CARE: Admit for overnight observation  PATIENT DISPOSITION:  PACU - hemodynamically stable.

## 2017-07-23 NOTE — H&P (Signed)
History of Present Illness  The patient is a 41 year old male who presents with back pain. The patient reports low back symptoms including pain which began 1 month(s) ago without any known injury. and Symptoms include pain (sharp). The pain radiates to the left buttock, left thigh and left lower leg. The patient describes the pain as sharp. The patient describes the severity of their symptoms as 7 / 10. The patient feels as if the symptoms are worsening. Symptoms are exacerbated by standing, sitting and bending. Symptoms are relieved by activity modification, cold packs and nonsteroidal anti-inflammatory drugs (ibuprofen). The patient is not currently being treated for this problem. Past evaluation has included MRI of the lumbar spine. The patient states that this is not a Financial risk analyst case. Note for "Back pain": Patient has also had a spine injection. Done at [facility]. Dr.wang. Had it done 07/07/17. Pain injection did not help at all per patient.  Problems Reconciled   Allergies No Known Drug Allergies Allergies Reconciled   Family History Cancer  Mother. Diabetes Mellitus  Mother. Heart Disease  Maternal Grandfather.  Social History  Children  2 Current drinker  07/14/2017: Currently drinks beer only occasionally per week Current work status  working full time Exercise  Exercises rarely; does running / walking Living situation  live with spouse Marital status  married No history of drug/alcohol rehab  Not under pain contract  Number of flights of stairs before winded  4-5 Tobacco / smoke exposure  07/14/2017: no Tobacco use  Never smoker. 07/14/2017: smoke(d) less than 1/2 pack(s) per day uses less than 1/2 can(s) smokeless per week  Medication History  Gabapentin (  Capsule, Oral) Active.  Past Surgical History ) Inguinal Hernia Repair  open: right Other Orthopaedic Surgery  Vasectomy   Review of Systems General Not Present- Appetite  Loss, Chills, Fatigue, Feeling sick, Fever, Night Sweats, Weight Gain and Weight Loss. Skin Not Present- Change in Hair or Nails, Itching, Psoriasis, Rash, Skin Color Changes and Ulcer. HEENT Not Present- Hearing problems, Nose Bleed, Ringing in the Ears and Sensitivity to light. Neck Not Present- Neck Mass and Swollen Glands. Respiratory Not Present- Bloody sputum, Chronic Cough, Dyspnea and Snoring. Cardiovascular Not Present- Chest Pain, Leg Cramps, Palpitations, Shortness of Breath and Swelling of Extremities. Gastrointestinal Not Present- Abdominal Pain, Bloody Stool, Heartburn, Incontinence of Stool, Nausea and Vomiting. Male Genitourinary Not Present- Blood in Urine, Frequency, Incontinence and Nocturia. Musculoskeletal Present- Back Pain. Not Present- Joint Pain, Joint Stiffness, Joint Swelling, Muscle Pain and Muscle Weakness. Neurological Present- Tingling. Not Present- Burning, Dizziness, Headaches, Numbness and Tremor. Psychiatric Not Present- Anxiety, Depression and Memory Loss. Endocrine Not Present- Cold Intolerance, Excessive hunger, Excessive Thirst and Heat Intolerance. Hematology Not Present- Abnormal Bleeding, Anemia, Blood Clots and Easy Bruising.  Vitals  07/14/2017 9:19 AM Weight: 204 lb Height: 76in Body Surface Area: 2.23 m Body Mass Index: 24.83 kg/m  BP: 123/82 (Sitting, Left Arm, Standard)  Note:medical exam. He is a pleasant gentlemanwho appears much younger than his stated age. He is alert and oriented 3. He has severe low back left buttock and left radicular leg pain.  No shortness of breath, chest pain. Abdomen is soft and nontender. No incontinence of bowel and bladder Lungs are clear to auscultation, no rubs gallops murmurs. No obvious skin lesions abrasionsn to the lumbar spine. He stands with a postural scoliosis due to severe radicular left leg pain, left lower back pain. He is unable to return to a neutral position because  of the severe pain.  He has loss and flexion extension and rotation due to the severe back buttock and radicular left leg pain.  He has a positive left straight leg raise test with reproduction of S1 radicular pain. Positive contralateral straight leg raise test with reproduction of left leg pain with right leg raise. 5 out of 5 strength in the lower extremity, decreased sensation to light touch in the S1 distribution on the left side only. No significant hip knee or ankle pain with isolated joint range of motion. Negative Babinski test, no clonus, negative Hoffmann sign. Compartments are soft and nontender in the lower extremity. Intact peripheral pulses in the lower extremity bilaterally.  Assessment & Plan   Goal Of Surgery: Discussed that goal of surgery is to reduce pain and improve function and quality of life. Patient is aware that despite all appropriate treatment that there pain and function could be the same, worse, or different.  Posterior Lumbar Decompression/disectomy: Risks of surgery include infection, bleeding, nerve damage, death, stroke, paralysis, failure to heal, need for further surgery, ongoing or worse pain, need for further surgery, CSF leak, loss of bowel or bladder, and recurrent disc herniation or Stenosis which would necessitate need for further surgery.  I reviewed his June 27, 2017 MRI as well as his MRI from 8 2012. the patient shows a larger disc herniation at L5-S1 posterior lateral to the left when compared to 2012. there is more advanced degenerative disc disease at L5-S1 now than there was in 2012.  Impression. Acute L5-S1 disc herniation with radicular weakness, sensory deficits, and pain (S1 distribution left side) at this point in time it is very clearthat the patient has an acute disc herniationwith new onset of radicular leg pain. While he did have a disc herniation in 2012 the radicular leg pain he had then resolved with injection therapy and self-directed exercises.  At this point though he is already had a selective nerve root block and it is not shown any improvement in his pain. Although he has no motor deficits he does have sensory changes and a positive nerve root tension sign consistent with acute S1 radiculopathy. His MRI confirms S1 radicular compression from a disc herniation. We had a long discussionabout management of the acute leg pain and his chronic back pain. At this point I think the best course of action is moving forward with a discectomy to address the acute radicular leg pain. In the futurehe may requiresurgical intervention for his back pain which would be fusion. At this point is quite clear that this is an acute injury with acute neurological deficits. We have discussed all the risks and benefits of surgery and all of his and his wife's questions were addressed.

## 2017-07-23 NOTE — Anesthesia Preprocedure Evaluation (Signed)
Anesthesia Evaluation  Patient identified by MRN, date of birth, ID band Patient awake    Reviewed: Allergy & Precautions, NPO status , Patient's Chart, lab work & pertinent test results  Airway Mallampati: II  TM Distance: >3 FB Neck ROM: Full    Dental no notable dental hx.    Pulmonary neg pulmonary ROS,    Pulmonary exam normal breath sounds clear to auscultation       Cardiovascular negative cardio ROS Normal cardiovascular exam Rhythm:Regular Rate:Normal     Neuro/Psych negative neurological ROS  negative psych ROS   GI/Hepatic negative GI ROS, Neg liver ROS,   Endo/Other  negative endocrine ROS  Renal/GU negative Renal ROS  negative genitourinary   Musculoskeletal negative musculoskeletal ROS (+)   Abdominal   Peds negative pediatric ROS (+)  Hematology negative hematology ROS (+)   Anesthesia Other Findings   Reproductive/Obstetrics negative OB ROS                             Anesthesia Physical Anesthesia Plan  ASA: II  Anesthesia Plan: General   Post-op Pain Management:    Induction: Intravenous  PONV Risk Score and Plan: 2 and Ondansetron, Dexamethasone, Treatment may vary due to age or medical condition and Midazolam  Airway Management Planned: Oral ETT  Additional Equipment:   Intra-op Plan:   Post-operative Plan: Extubation in OR  Informed Consent:   Plan Discussed with:   Anesthesia Plan Comments: (  )       Anesthesia Quick Evaluation  

## 2017-07-23 NOTE — Progress Notes (Signed)
Orthopedic Tech Progress Note Patient Details:  Mitchell Mccullough [birth year] [id number] Brace completed by [employer]. Patient ID: Mitchell Mccullough, male   DOB: 1976/08/01, 41 y.o.   MRN: [id number]   Mitchell Mccullough 07/23/2017, 6:25 PM

## 2017-07-23 NOTE — Op Note (Signed)
Operative note.  Preoperative diagnosis L5-S1 left-sided lumbar disc herniation. Left S1 radiculopathy. Her graft postoperative diagnosis. Same.  Operative procedure. L5-S1 left-sided lumbar discectomy.  Complications. None.  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  Indications. This 41 year old gentleman who's had severe radicular left leg pain, numbness and dysesthesias for several weeks.  No significant relief of symptoms with Neurontin, selective nerve root blocks, pain medications. 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 All appropriate risks benefits and all alternatives to surgery were discussed and consent was obtained.  Operative note. Patient was brought to the operating room placed supine the operating room table. After successful induction of general anesthesia and endotracheal intubation teds SCDs were applied and he was turned prone onto the Wilson frame. All bony prominences well-padded and the back was prepped and draped in a standard fashion. Timeout was taken to confirm patient procedure and all other important data.  2 needles were placed into the back and x-ray was taken to localize our incision. I marked out the incision site and infiltrated with quarter percent Marcaine with epinephrine. Midline incision was made centered over the L5-S1 disc and sharp dissection was carried out down to the deep fascia. I then stripped the deep fascia and paraspinal muscles to expose the left side of the spinous process of L5 and the left lamina of L5. A Penfield 4 was placed underneath the L5 lamina and a second x-ray was taken to confirm the level. Once this was confirmed I proceeded with performing a laminotomy of L5. Using a Kerrison rongeur 2 mm, and 3 mm I performed a laminotomy of L5. I then used a Penfield 4 to dissect through the ligamentum  flavum and ultimately resected with Kerrison rongeur. Once this was done I noted significant dorsal displacement of the S1 nerve root. Very difficult to mobilize this nerve. I then began dissecting into the lateral recess performing a medial facetectomy in order to gain access into the lateral recess. I continued my dissection inferiorly until I could visualize the S1 pedicle. I then placed neuro patties superiorly and inferiorly to aid in retraction and then swept thecal sac medially. There was a significant dorsal displacement of the annulus. This was incised with a 15 blade scalpel and I continued using a nerve hook to remove 2 large fragments of disc material. I then used [employer] to help remove the osteophyte. After carefully excising the fragments of disc I checked the intervertebral disc space with a micropituitary rongeur to ensure there was no free fragments still remaining. At this point I noted very large epidural veins which were coagulated with bipolar cautery. At the conclusion of the discectomy I was able to now freely mobilized the S1 nerve root. It no longer was dorsally displaced. I could easily pass my nerve hook and Woodson elevator into the S1 foramen. I could also pass superiorly and medially over the annulus. There was still some osteophyte and annular thickening which I attempted to remove using the Epstein curettes. However I did not feel as though the excessive retraction was necessary on the thecal sac and nerve root was beneficial to the patient. At this point for fear of causing a CSF leak or nerve injury I elected to conclude the case. I again checked to ensure that neural decompression was satisfactory which it was was able to mobilize the nerve and pass my Public house manager  underneath the nerve and along the lateral recess and medially.  At this point because of the retraction that I performed I did elect to place some Depo-Medrol. It was approximately 1/2 mL fluoroscopy  which corresponded to 20 mg of Depo-Medrol. Prior to putting Depo-Medrol I did irrigate copiously with normal saline and ensure there was no ongoing bleeding from the epidural veins. I then placed thrombin-soaked Gelfoam padding over the laminotomy site and closed the wound in a layered fashion with interrupted #1 Vicryl suture, 2-0 Vicryl suture, and 3-0 Monocryl. Steri-Strips dry dressings were applied and the patient was ultimately extubated transferred the PACU that incident end of the case all needle sponge counts were correct.

## 2017-07-24 ENCOUNTER — Encounter (HOSPITAL_COMMUNITY): Payer: Self-pay | Admitting: Orthopedic Surgery

## 2017-07-24 MED FILL — Thrombin For Soln 20000 Unit: CUTANEOUS | Qty: 1 | Status: AC

## 2017-07-24 NOTE — Evaluation (Signed)
Occupational Therapy Evaluation and Discharge Patient Details Name: Mitchell Mccullough MRN: [id number] DOB: 09/23/1976 Today's Date: 07/24/2017    History of Present Illness Pt is a 41 y.o. male s/p L5-S1 Microdisectomy. No pertinent PMHx in chart.   Clinical Impression   Pt reports she was independent with ADL PTA. Currently pt supervision with ADL and functional mobility. All back, safety, and ADL education completed with pt. Pt planning to d/c home with 24/7 supervision from family. No further acute OT needs identified; signing off at this time. Please re-consult if needs change. Thank you for this referral.    Follow Up Recommendations  No OT follow up;Supervision - Intermittent    Equipment Recommendations  None recommended by OT    Recommendations for Other Services       Precautions / Restrictions Precautions Precautions: Back Precaution Booklet Issued: Yes (comment) Precaution Comments: Educated pt on 3/3 back precautions Required Braces or Orthoses: Spinal Brace Spinal Brace: Lumbar corset Restrictions Weight Bearing Restrictions: No      Mobility Bed Mobility Overal bed mobility: Needs Assistance Bed Mobility: Rolling;Sidelying to Sit Rolling: Supervision Sidelying to sit: Supervision       General bed mobility comments: Supervision for safety, cues for log roll technique  Transfers Overall transfer level: Needs assistance Equipment used: None Transfers: Sit to/from Stand Sit to Stand: Supervision         General transfer comment: for safety, no unsteadiness or LOB    Balance Overall balance assessment: No apparent balance deficits (not formally assessed)                                         ADL either performed or assessed with clinical judgement   ADL Overall ADL's : Needs assistance/impaired Eating/Feeding: Set up;Sitting   Grooming: Set up;Standing;Supervision/safety Grooming Details (indicate cue type and reason):  Educated on use of 2 cups for oral care Upper Body Bathing: Set up;Supervision/ safety;Standing   Lower Body Bathing: Set up;Supervison/ safety;Sit to/from stand   Upper Body Dressing : Set up;Supervision/safety;Standing Upper Body Dressing Details (indicate cue type and reason): to don shirt and brace Lower Body Dressing: Set up;Supervision/safety;Sit to/from stand Lower Body Dressing Details (indicate cue type and reason): Educated on compensatory strategies for LB ADL. Toilet Transfer: Supervision/safety;Ambulation;Comfort height toilet;Grab bars     Toileting - Clothing Manipulation Details (indicate cue type and reason): Educated on proper technique for peri care without twisting. Tub/ Shower Transfer: Supervision/safety;Walk-in shower;Ambulation   Functional mobility during ADLs: Supervision/safety General ADL Comments: Educated pt on log roll technique, frequent mobility thoruhgout the day upon return home, keeping frequently used items at coutner top height, maintaining back precautions during functioanl activities.     Vision         Perception     Praxis      Pertinent Vitals/Pain Pain Assessment: Faces Faces Pain Scale: Hurts little more Pain Location: back Pain Descriptors / Indicators: Discomfort;Sore Pain Intervention(s): Monitored during session;Repositioned     Hand Dominance     Extremity/Trunk Assessment Upper Extremity Assessment Upper Extremity Assessment: Overall WFL for tasks assessed   Lower Extremity Assessment Lower Extremity Assessment: Defer to PT evaluation   Cervical / Trunk Assessment Cervical / Trunk Assessment: Other exceptions Cervical / Trunk Exceptions: s/p spinal sx   Communication Communication Communication: No difficulties   Cognition Arousal/Alertness: Awake/alert Behavior During Therapy: WFL for tasks assessed/performed Overall Cognitive Status:  Within Functional Limits for tasks assessed                                      General Comments       Exercises     Shoulder Instructions      Home Living Family/patient expects to be discharged to:: Private residence Living Arrangements: Spouse/significant other;Children Available Help at Discharge: Family;Available PRN/intermittently Type of Home: House Home Access: Stairs to enter Entrance Stairs-Number of Steps: 7-8   Home Layout: Other (Comment);Multi-level (split level) Alternate Level Stairs-Number of Steps: 6 between each level   Bathroom Shower/Tub: Producer, television/film/video: Handicapped height     Home Equipment: Grab bars - toilet;Grab bars - tub/shower          Prior Functioning/Environment Level of Independence: Independent                 OT Problem List:        OT Treatment/Interventions:      OT Goals(Current goals can be found in the care plan section) Acute Rehab OT Goals Patient Stated Goal: return home OT Goal Formulation: All assessment and education complete, DC therapy  OT Frequency:     Barriers to D/C:            Co-evaluation              AM-PAC PT "6 Clicks" Daily Activity     Outcome Measure Help from another person eating meals?: None Help from another person taking care of personal grooming?: A Little Help from another person toileting, which includes using toliet, bedpan, or urinal?: A Little Help from another person bathing (including washing, rinsing, drying)?: A Little Help from another person to put on and taking off regular upper body clothing?: A Little Help from another person to put on and taking off regular lower body clothing?: A Little 6 Click Score: 19   End of Session Equipment Utilized During Treatment: Back brace Nurse Communication: Mobility status;Other (comment) (no equipment or f/u needs)  Activity Tolerance: Patient tolerated treatment well Patient left: in chair;with call bell/phone within reach  OT Visit Diagnosis: Pain Pain - part of  body:  (back)                Time: 1610-9604 OT Time Calculation (min): 12 min Charges:  OT General Charges $OT Visit: 1 Visit OT Evaluation $OT Eval Low Complexity: 1 Low G-Codes: OT G-codes **NOT FOR INPATIENT CLASS** Functional Assessment Tool Used: Clinical judgement Functional Limitation: Self care Self Care Current Status (V4098): At least 1 percent but less than 20 percent impaired, limited or restricted Self Care Goal Status (J1914): At least 1 percent but less than 20 percent impaired, limited or restricted Self Care Discharge Status [phone]): At least 1 percent but less than 20 percent impaired, limited or restricted   Fredric Mare A. Brett Albino, M.S., OTR/L Pager: [phone]  Gaye Alken 07/24/2017, 9:29 AM

## 2017-07-24 NOTE — Progress Notes (Signed)
    Subjective: Procedure(s) (LRB): Microdisectomy left Lumbar 5 - Sacral 1 (Left) 1 Day Post-Op  Patient reports pain as 2 on 0-10 scale.  Reports none leg pain reports incisional back pain   Positive void Negative bowel movement Positive flatus Negative chest pain or shortness of breath  Objective: Vital signs in last 24 hours: Temp:  [97.4 F (36.3 C)-100.3 F (37.9 C)] 97.4 F (36.3 C) (09/28 0344) Pulse Rate:  [72-101] 90 (09/28 0344) Resp:  [7-20] 16 (09/28 0344) BP: (105-158)/(64-86) 114/78 (09/28 0344) SpO2:  [94 %-100 %] 100 % (09/28 0344) Weight:  [90.7 kg (200 lb)] 90.7 kg (200 lb) (09/27 1055)  Intake/Output from previous day: 09/27 0701 - 09/28 0700 In: 1642 [P.O.:440; I.V.:1202] Out: 50 [Blood:50]  Labs:  Recent Labs  07/23/17 1059  WBC 6.6  RBC 5.24  HCT 47.1  PLT 175   No results for input(s): NA, K, CL, CO2, BUN, CREATININE, GLUCOSE, CALCIUM in the last 72 hours. No results for input(s): LABPT, INR in the last 72 hours.  Physical Exam: Neurologically intact ABD soft Intact pulses distally Dorsiflexion/Plantar flexion intact Incision: dressing C/D/I and no drainage Compartment soft negative st. leg raise  Assessment/Plan: Patient stable  xrays n/a Continue mobilization with physical therapy Continue care  Advance diet Up with therapy  Doing well  Plan on d/c to home   Venita Lick, MD [facility] [phone]

## 2017-07-24 NOTE — Evaluation (Signed)
Physical Therapy Evaluation Patient Details Name: Mitchell Mccullough MRN: [id number] DOB: 1976-10-05 Today's Date: 07/24/2017   History of Present Illness  Pt is a 41 y.o. male s/p L5-S1 Microdisectomy. No pertinent PMHx in chart.  Clinical Impression  Patient evaluated by Physical Therapy with no further acute PT needs identified. All education has been completed and the patient has no further questions. At the time of PT eval pt was able to perform transfers and ambulation with gross modified independence. Pt was educated on precautions, walking program, stair training, and general safety with mobility/activity progression. See below for any follow-up Physical Therapy or equipment needs. PT is signing off. Thank you for this referral.      Follow Up Recommendations Outpatient PT;Supervision - Intermittent (When appropriate per post-op protocol)    Equipment Recommendations  None recommended by PT    Recommendations for Other Services       Precautions / Restrictions Precautions Precautions: Back Precaution Booklet Issued: Yes (comment) Precaution Comments: Educated pt on 3/3 back precautions Required Braces or Orthoses: Spinal Brace Spinal Brace: Lumbar corset Restrictions Weight Bearing Restrictions: No      Mobility  Bed Mobility Overal bed mobility: Needs Assistance Bed Mobility: Rolling;Sidelying to Sit Rolling: Supervision Sidelying to sit: Supervision       General bed mobility comments: Pt sitting up in recliner upon PT arrival.   Transfers Overall transfer level: Needs assistance Equipment used: None Transfers: Sit to/from Stand Sit to Stand: Modified independent (Device/Increase time)         General transfer comment: No unsteadiness or LOB noted. Pt was able to power up to full stand with no assist - demonstrated proper hand placement on seated surface for safety.   Ambulation/Gait Ambulation/Gait assistance: Modified independent (Device/Increase  time) Ambulation Distance (Feet): 250 Feet Assistive device: None Gait Pattern/deviations: Step-through pattern;Decreased stride length Gait velocity: Decreased Gait velocity interpretation: Below normal speed for age/gender General Gait Details: Pt with an obvious posture deviation - trunk to the R with hips to the L. Pt able to ambulate mod I without difficulty, however hands-on correction provided to facilitate a more natural posture and gait pattern. Wife present and instructed in facilitating this at home.   Stairs Stairs: Yes Stairs assistance: Supervision Stair Management: One rail Left;Step to pattern;Forwards Number of Stairs: 10 General stair comments: VC's for sequencing and safety. No assist required.   Wheelchair Mobility    Modified Rankin (Stroke Patients Only)       Balance Overall balance assessment: No apparent balance deficits (not formally assessed)                                           Pertinent Vitals/Pain Pain Assessment: Faces Faces Pain Scale: Hurts little more Pain Location: back Pain Descriptors / Indicators: Discomfort;Sore Pain Intervention(s): Limited activity within patient's tolerance;Monitored during session;Repositioned    Home Living Family/patient expects to be discharged to:: Private residence Living Arrangements: Spouse/significant other;Children Available Help at Discharge: Family;Available PRN/intermittently Type of Home: House Home Access: Stairs to enter   Entrance Stairs-Number of Steps: 7-8 Home Layout: Other (Comment);Multi-level (split level) Home Equipment: Grab bars - toilet;Grab bars - tub/shower      Prior Function Level of Independence: Independent               Hand Dominance   Dominant Hand: Right    Extremity/Trunk Assessment  Upper Extremity Assessment Upper Extremity Assessment: Defer to OT evaluation    Lower Extremity Assessment Lower Extremity Assessment: Generalized  weakness (Consistent with pre-op diagnosis)    Cervical / Trunk Assessment Cervical / Trunk Assessment: Other exceptions Cervical / Trunk Exceptions: s/p spinal sx. Noted trunk shift to the R with hip shift to the L.   Communication   Communication: No difficulties  Cognition Arousal/Alertness: Awake/alert Behavior During Therapy: WFL for tasks assessed/performed Overall Cognitive Status: Within Functional Limits for tasks assessed                                        General Comments      Exercises     Assessment/Plan    PT Assessment Patent does not need any further PT services  PT Problem List         PT Treatment Interventions      PT Goals (Current goals can be found in the Care Plan section)  Acute Rehab PT Goals Patient Stated Goal: return home today PT Goal Formulation: All assessment and education complete, DC therapy    Frequency     Barriers to discharge        Co-evaluation               AM-PAC PT "6 Clicks" Daily Activity  Outcome Measure Difficulty turning over in bed (including adjusting bedclothes, sheets and blankets)?: None Difficulty moving from lying on back to sitting on the side of the bed? : None Difficulty sitting down on and standing up from a chair with arms (e.g., wheelchair, bedside commode, etc,.)?: None Help needed moving to and from a bed to chair (including a wheelchair)?: None Help needed walking in hospital room?: None Help needed climbing 3-5 steps with a railing? : None 6 Click Score: 24    End of Session Equipment Utilized During Treatment: Gait belt;Back brace Activity Tolerance: Patient tolerated treatment well Patient left: Other (comment) (Standing in room with wife present) Nurse Communication: Mobility status PT Visit Diagnosis: Pain;Other symptoms and signs involving the nervous system (R29.898) Pain - part of body:  (back)    Time: 9604-5409 PT Time Calculation (min) (ACUTE ONLY): 17  min   Charges:   PT Evaluation $PT Eval Moderate Complexity: 1 Mod     PT G Codes:   PT G-Codes **NOT FOR INPATIENT CLASS** Functional Assessment Tool Used: Clinical judgement Functional Limitation: Mobility: Walking and moving around Mobility: Walking and Moving Around Current Status (W1191): At least 1 percent but less than 20 percent impaired, limited or restricted Mobility: Walking and Moving Around Goal Status [phone]): At least 1 percent but less than 20 percent impaired, limited or restricted Mobility: Walking and Moving Around Discharge Status [phone]): At least 1 percent but less than 20 percent impaired, limited or restricted    Conni Slipper, PT, DPT Acute Rehabilitation Services Pager: [phone]   Marylynn Pearson 07/24/2017, 10:47 AM

## 2017-07-24 NOTE — Progress Notes (Signed)
Patient is discharged from room 3C10 at this time. Alert and in stable condition. IV site d/c'd and instructions read to patient and wife with understanding verbalized. Left unit via wheelchair with all belongings at side.  

## 2017-07-27 NOTE — Discharge Summary (Signed)
Physician Discharge Summary  Patient ID: Mitchell Mccullough MRN: [id number] DOB/AGE: 40-Jun-1977 40 y.o.  Admit date: 07/23/2017 Discharge date: 07/24/17  Admission Diagnoses:  L5-S1 Lumbar Disc Herniation Discharge Diagnoses:  Active Problems:   S/P lumbar microdiscectomy   Past Medical History:  Diagnosis Date  . Lumbar disc herniation    L5-S1    Surgeries: Procedure(s): Microdisectomy left Lumbar 5 - Sacral 1 on 07/23/2017   Consultants (if any):   Discharged Condition: Improved  Hospital Course: Mitchell Mccullough is an 41 y.o. male who was admitted 07/23/2017 with a diagnosis of L5-S1 Lumbar Disc Herniation and went to the operating room on 07/23/2017 and underwent the above named procedures.  Post op day one pt reports low level of pain controlled on oral medication.  Pt reports decrease leg pain.  Pt is urinating w/o difficulty. Pt has been walking in hallway.  Pt is cleared by PT before DC.   He was given perioperative antibiotics:  Anti-infectives    Start     Dose/Rate Route Frequency Ordered Stop   07/23/17 2000  ceFAZolin (ANCEF) IVPB 2g/100 mL premix     2 g 200 mL/hr over 30 Minutes Intravenous Every 8 hours 07/23/17 1544 07/24/17 0415   07/23/17 1031  ceFAZolin (ANCEF) 2-4 GM/100ML-% IVPB    Comments:  Forte, Lindsi   : cabinet override      07/23/17 1031 07/23/17 1155   07/23/17 1029  ceFAZolin (ANCEF) IVPB 2g/100 mL premix     2 g 200 mL/hr over 30 Minutes Intravenous 30 min pre-op 07/23/17 1029 07/23/17 1155    .  He was given sequential compression devices, early ambulation, and TED for DVT prophylaxis.  He benefited maximally from the hospital stay and there were no complications.    Recent vital signs:  Vitals:   07/24/17 0344 07/24/17 0831  BP: 114/78 122/67  Pulse: 90 93  Resp: 16 16  Temp: (!) 97.4 F (36.3 C) 98.4 F (36.9 C)  SpO2: 100% 100%    Recent laboratory studies:  Lab Results  Component Value Date   HGB 16.2 07/23/2017   HGB 16.5  04/14/2013   Lab Results  Component Value Date   WBC 6.6 07/23/2017   PLT 175 07/23/2017   No results found for: INR Lab Results  Component Value Date   NA 137 04/14/2013   K 4.6 04/14/2013   CL 100 04/14/2013   CO2 29 04/14/2013   BUN 21 04/14/2013   CREATININE 1.11 04/14/2013   GLUCOSE 101 (H) 04/14/2013    Discharge Medications:   Allergies as of 07/24/2017   No Known Allergies     Medication List    STOP taking these medications   ibuprofen 200 MG tablet Commonly known as:  ADVIL,MOTRIN   ICY HOT EX     TAKE these medications   acetaminophen 325 MG tablet Commonly known as:  TYLENOL Take 650 mg by mouth every 6 (six) hours as needed (for pain/headache.).   gabapentin 300 MG capsule Commonly known as:  NEURONTIN Take 300 mg by mouth 3 (three) times daily as needed (for nerve pain.).   methocarbamol 500 MG tablet Commonly known as:  ROBAXIN Take 1 tablet (500 mg total) by mouth 3 (three) times daily.   NYQUIL PO Take 1 tablet by mouth at bedtime as needed (for sleep).   ondansetron 4 MG disintegrating tablet Commonly known as:  ZOFRAN ODT Take 1 tablet (4 mg total) by mouth every 8 (eight) hours as  needed for nausea or vomiting.   oxyCODONE-acetaminophen 10-325 MG tablet Commonly known as:  PERCOCET Take 1 tablet by mouth every 4 (four) hours as needed for pain.       Diagnostic Studies: Dg Lumbar Spine 1 View  Result Date: 07/23/2017 CLINICAL DATA:  L5-S1 microdiskectomy EXAM: LUMBAR SPINE - 1 VIEW COMPARISON:  Lumbar MRI June 27, 2017 FINDINGS: Cross-table lateral lumbar image labeled #1 submitted. Metallic probe tips are posterior to the inferior aspect of the S1 vertebral body and midportion of the S2 vertebral body respectively. No fracture or spondylolisthesis. Disc spaces appear normal. IMPRESSION: Metallic probe tips are posterior to the inferior aspect of the S1 vertebral body and the midportion of the S2 vertebral body respectively. No  fracture or spondylolisthesis. Disc spaces appear normal. Electronically Signed   By: Bretta Bang III M.D.   On: 07/23/2017 14:00   Dg Spine Portable 1 View  Result Date: 07/23/2017 CLINICAL DATA:  L5-S1 disc herniation.  Microdiscectomy. EXAM: PORTABLE SPINE - 1 VIEW COMPARISON:  MRI lumbar spine 06/27/2017, 03/10/2011. FINDINGS: Five non-rib-bearing lumbar vertebrae as identified on the prior MRI examinations. The localizer device is directed toward the L5-S1 disc space. IMPRESSION: L5-S1 localized intraoperatively. Electronically Signed   By: Hulan Saas M.D.   On: 07/23/2017 12:22    Disposition: 01-Home or Self Care Pt will present to clinic in 2 weeks Post op medication provided  Discharge Instructions    Incentive spirometry RT    Complete by:  As directed          Signed: Kirt Boys 07/27/2017, 1:32 PM

## 2018-08-28 ENCOUNTER — Other Ambulatory Visit: Payer: Self-pay

## 2018-08-28 ENCOUNTER — Emergency Department (HOSPITAL_COMMUNITY)
Admission: EM | Admit: 2018-08-28 | Discharge: 2018-08-28 | Disposition: A | Discharge status: Home / Self Care | Payer: Self-pay | Attending: Emergency Medicine | Admitting: Emergency Medicine

## 2018-08-28 ENCOUNTER — Encounter (HOSPITAL_COMMUNITY): Payer: Self-pay | Admitting: *Deleted

## 2018-08-28 DIAGNOSIS — Z23 Encounter for immunization: Secondary | ICD-10-CM | POA: Insufficient documentation

## 2018-08-28 DIAGNOSIS — Y93G1 Activity, food preparation and clean up: Secondary | ICD-10-CM | POA: Insufficient documentation

## 2018-08-28 DIAGNOSIS — Y9289 Other specified places as the place of occurrence of the external cause: Secondary | ICD-10-CM | POA: Insufficient documentation

## 2018-08-28 DIAGNOSIS — W268XXA Contact with other sharp object(s), not elsewhere classified, initial encounter: Secondary | ICD-10-CM | POA: Insufficient documentation

## 2018-08-28 DIAGNOSIS — Z79899 Other long term (current) drug therapy: Secondary | ICD-10-CM | POA: Insufficient documentation

## 2018-08-28 DIAGNOSIS — S61214A Laceration without foreign body of right ring finger without damage to nail, initial encounter: Secondary | ICD-10-CM | POA: Insufficient documentation

## 2018-08-28 DIAGNOSIS — Y998 Other external cause status: Secondary | ICD-10-CM | POA: Insufficient documentation

## 2018-08-28 MED ORDER — TETANUS-DIPHTH-ACELL PERTUSSIS 5-2.5-18.5 LF-MCG/0.5 IM SUSP
0.5000 mL | Freq: Once | INTRAMUSCULAR | Status: AC
  Administered 2018-08-28: 0.5 mL via INTRAMUSCULAR
  Filled 2018-08-28: qty 0.5

## 2018-08-28 NOTE — ED Triage Notes (Signed)
Pt has laceration to right ring finger. Pt was slicing a vegetable with a mandolin.

## 2018-08-28 NOTE — ED Provider Notes (Signed)
[facility]-EMERGENCY DEPT Provider Note   CSN: [id number] Arrival date & time: 08/28/18  1824     History   Chief Complaint Chief Complaint  Patient presents with  . Laceration    HPI Mitchell Mccullough is a 42 y.o. male who presents to the ED with a laceration to the ring finger of the right hand. Patient reports he was using a mandolin to slice vegetables and it slipped. Patient unsure of last tetanus.   HPI  Past Medical History:  Diagnosis Date  . Lumbar disc herniation    L5-S1    Patient Active Problem List   Diagnosis Date Noted  . S/P lumbar microdiscectomy 07/23/2017    Past Surgical History:  Procedure Laterality Date  . arm surgery    . HERNIA REPAIR    . LUMBAR LAMINECTOMY/DECOMPRESSION MICRODISCECTOMY Left 07/23/2017   Procedure: Microdisectomy left Lumbar 5 - Sacral 1;  Surgeon: Venita Lick, MD;  Location: [facility] OR;  Service: Orthopedics;  Laterality: Left;  120 mins  . VASECTOMY          Home Medications    Prior to Admission medications   Medication Sig Start Date End Date Taking? Authorizing Provider  acetaminophen (TYLENOL) 325 MG tablet Take 650 mg by mouth every 6 (six) hours as needed (for pain/headache.).    [provider]  gabapentin (NEURONTIN) 300 MG capsule Take 300 mg by mouth 3 (three) times daily as needed (for nerve pain.).    [provider]  methocarbamol (ROBAXIN) 500 MG tablet Take 1 tablet (500 mg total) by mouth 3 (three) times daily. 07/23/17   Mayo, Baxter Kail, PA-C  ondansetron (ZOFRAN ODT) 4 MG disintegrating tablet Take 1 tablet (4 mg total) by mouth every 8 (eight) hours as needed for nausea or vomiting. 07/23/17   Mayo, Baxter Kail, PA-C  Pseudoeph-Doxylamine-DM-APAP (NYQUIL PO) Take 1 tablet by mouth at bedtime as needed (for sleep).    [provider]    Family History Family History  Problem Relation Age of Onset  . Diabetes Mother   . Thyroid disease Mother     . Breast cancer Mother   . Parkinson's disease Father   . Heart disease Other     Social History Social History   Tobacco Use  . Smoking status: Never Smoker  . Smokeless tobacco: Never Used  Substance Use Topics  . Alcohol use: Yes    Comment: rare  . Drug use: No     Allergies   Patient has no known allergies.   Review of Systems Review of Systems  Skin: Positive for wound.  All other systems reviewed and are negative.    Physical Exam Updated Vital Signs BP 106/75 (BP Location: Left Arm)   Pulse 63   Temp 98.4 F (36.9 C) (Oral)   Resp 19   Ht 6\' 2"  (1.88 m)   Wt 90.7 kg   SpO2 98%   BMI 25.68 kg/m   Physical Exam  Constitutional: He appears well-developed and well-nourished. No distress.  Eyes: EOM are normal.  Neck: Neck supple.  Cardiovascular: Normal rate.  Pulmonary/Chest: Effort normal.  Musculoskeletal:       Right hand: He exhibits tenderness and laceration. He exhibits normal range of motion, normal capillary refill and no swelling. Normal sensation noted. Normal strength noted.       Hands: There is a small avulsion laceration to the ulnar aspect of the right ring finger. The wound is superficial with minimal bleeding.  Neurological: He is alert.  Skin: Skin is warm and dry.  Wound right ring finger  Psychiatric: He has a normal mood and affect.  Nursing note and vitals reviewed.    ED Treatments / Results  Labs (all labs ordered are listed, but only abnormal results are displayed) Labs Reviewed - No data to display  EKG None  Radiology No results found.  Procedures .Marland KitchenLaceration Repair Date/Time: 08/28/2018 8:50 PM Performed by: Janne Napoleon, NP Authorized by: Janne Napoleon, NP   Consent:    Consent obtained:  Verbal   Consent given by:  Patient   Risks discussed:  Pain   Alternatives discussed:  No treatment Anesthesia (see MAR for exact dosages):    Anesthesia method:  None Laceration details:    Location:   Finger   Finger location:  R ring finger   Length (cm):  1 Repair type:    Repair type:  Simple Exploration:    Hemostasis achieved with:  Direct pressure   Wound exploration: entire depth of wound probed and visualized     Contaminated: no   Treatment:    Area cleansed with:  Saline and Shur-Clens   Irrigation solution:  Sterile saline Post-procedure details:    Dressing:  Non-adherent dressing   Patient tolerance of procedure:  Tolerated well, no immediate complications Comments:     Wound seal used to form artificial scab. Dressing applied.   (including critical care time)  Medications Ordered in ED Medications  Tdap (BOOSTRIX) injection 0.5 mL (has no administration in time range)     Initial Impression / Assessment and Plan / ED Course  I have reviewed the triage vital signs and the nursing notes. 42 y.o. male here with wound to the right ring finger stable for d/c without other injuries. Tetanus updated. Patient will return for any problems.   Final Clinical Impressions(s) / ED Diagnoses   Final diagnoses:  Laceration of right little finger without foreign body without damage to nail, initial encounter    ED Discharge Orders    None       Kerrie [place], NP 08/28/18 2052    Terrilee Files, MD 08/29/18 1259

## 2018-08-28 NOTE — Discharge Instructions (Addendum)
Keep the area dry. Redress the wound after 24 hours. Follow up with your doctor. Return here as needed.

## 2018-12-22 IMAGING — CR DG LUMBAR SPINE 1V
1 series · 1 of 1 positions shown · non-contrast
Comparison: Lumbar MRI June 27, 2017

CLINICAL DATA: L5-S1 microdiskectomy

EXAM:
LUMBAR SPINE - 1 VIEW

[lateral]
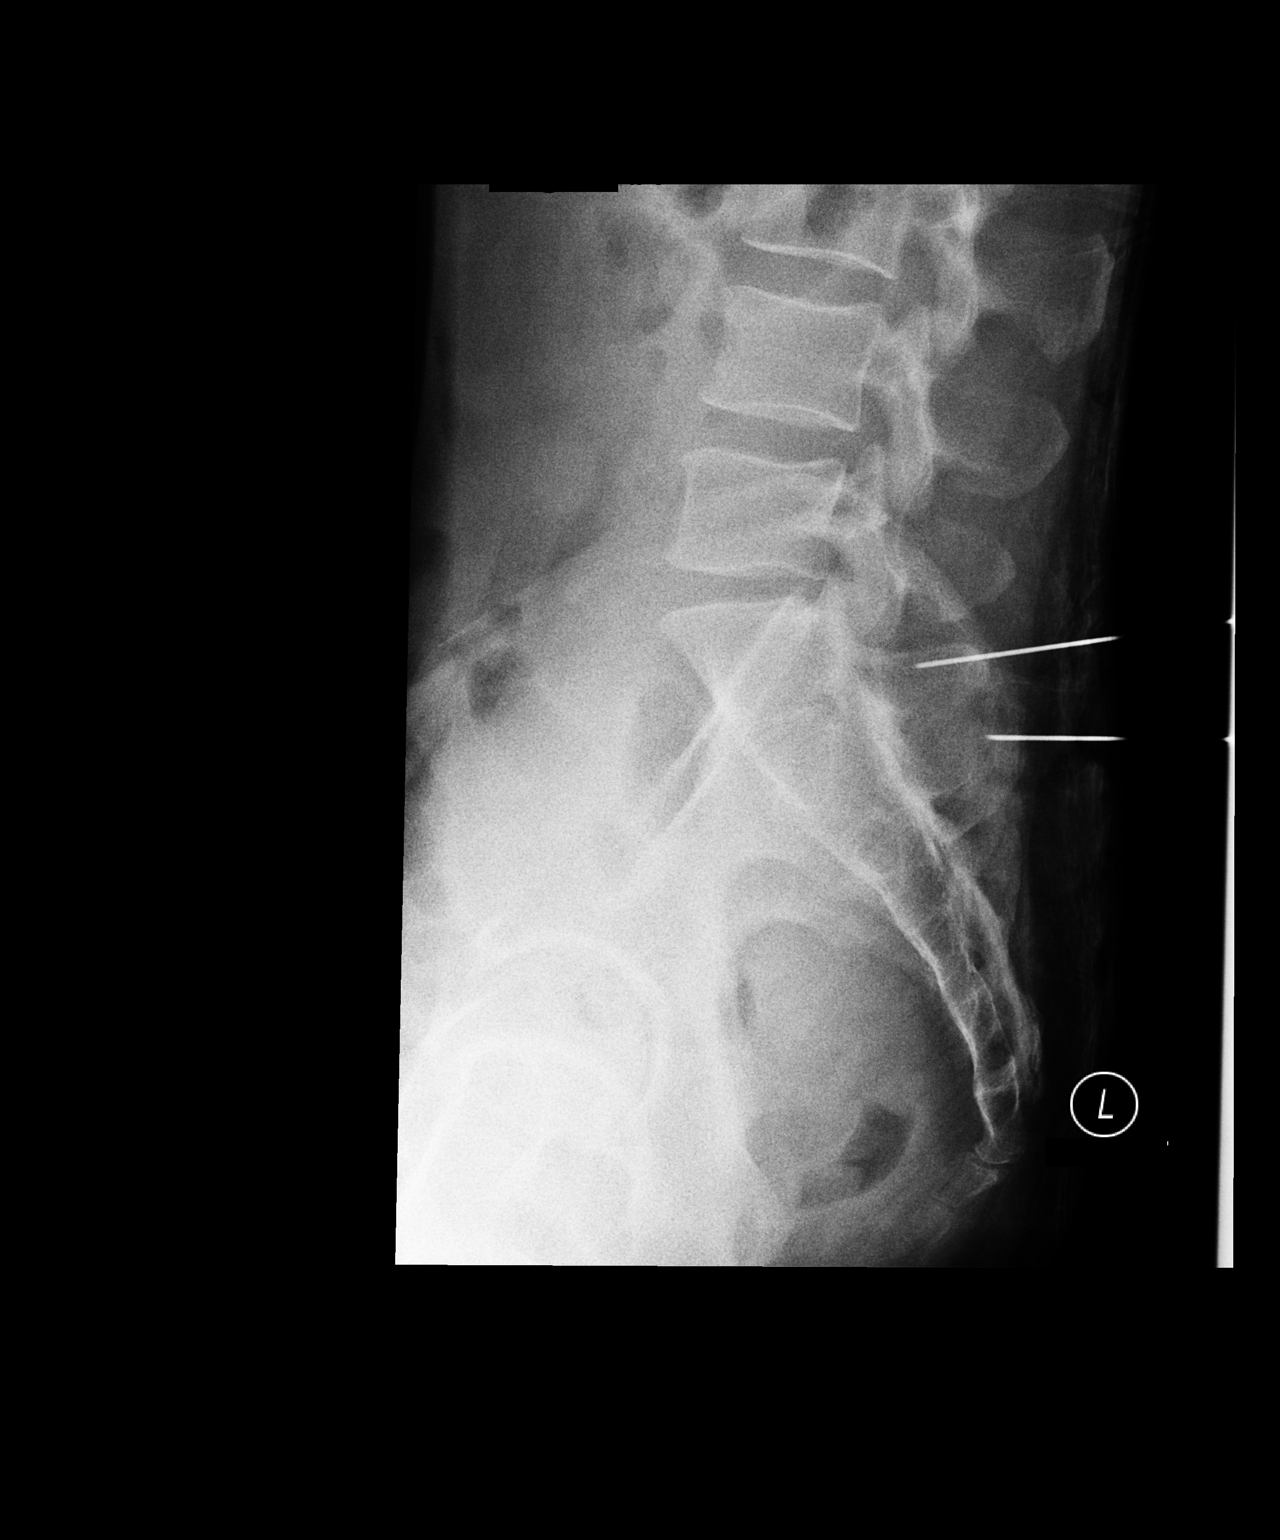

[1 of 1 positions shown; findings below may reference images not displayed]

FINDINGS: Cross-table lateral lumbar image labeled #1 submitted. Metallic
probe tips are posterior to the inferior aspect of the S1 vertebral
body and midportion of the S2 vertebral body respectively. No
fracture or spondylolisthesis. Disc spaces appear normal.
IMPRESSION: Metallic probe tips are posterior to the inferior aspect of the S1
vertebral body and the midportion of the S2 vertebral body
respectively. No fracture or spondylolisthesis. Disc spaces appear
normal.

## 2018-12-22 IMAGING — CR DG SPINE 1V PORT
1 series · 1 of 1 positions shown · non-contrast
Comparison: MRI lumbar spine 06/27/2017, 03/10/2011.

CLINICAL DATA: L5-S1 disc herniation.  Microdiscectomy.

EXAM:
PORTABLE SPINE - 1 VIEW

[lateral]
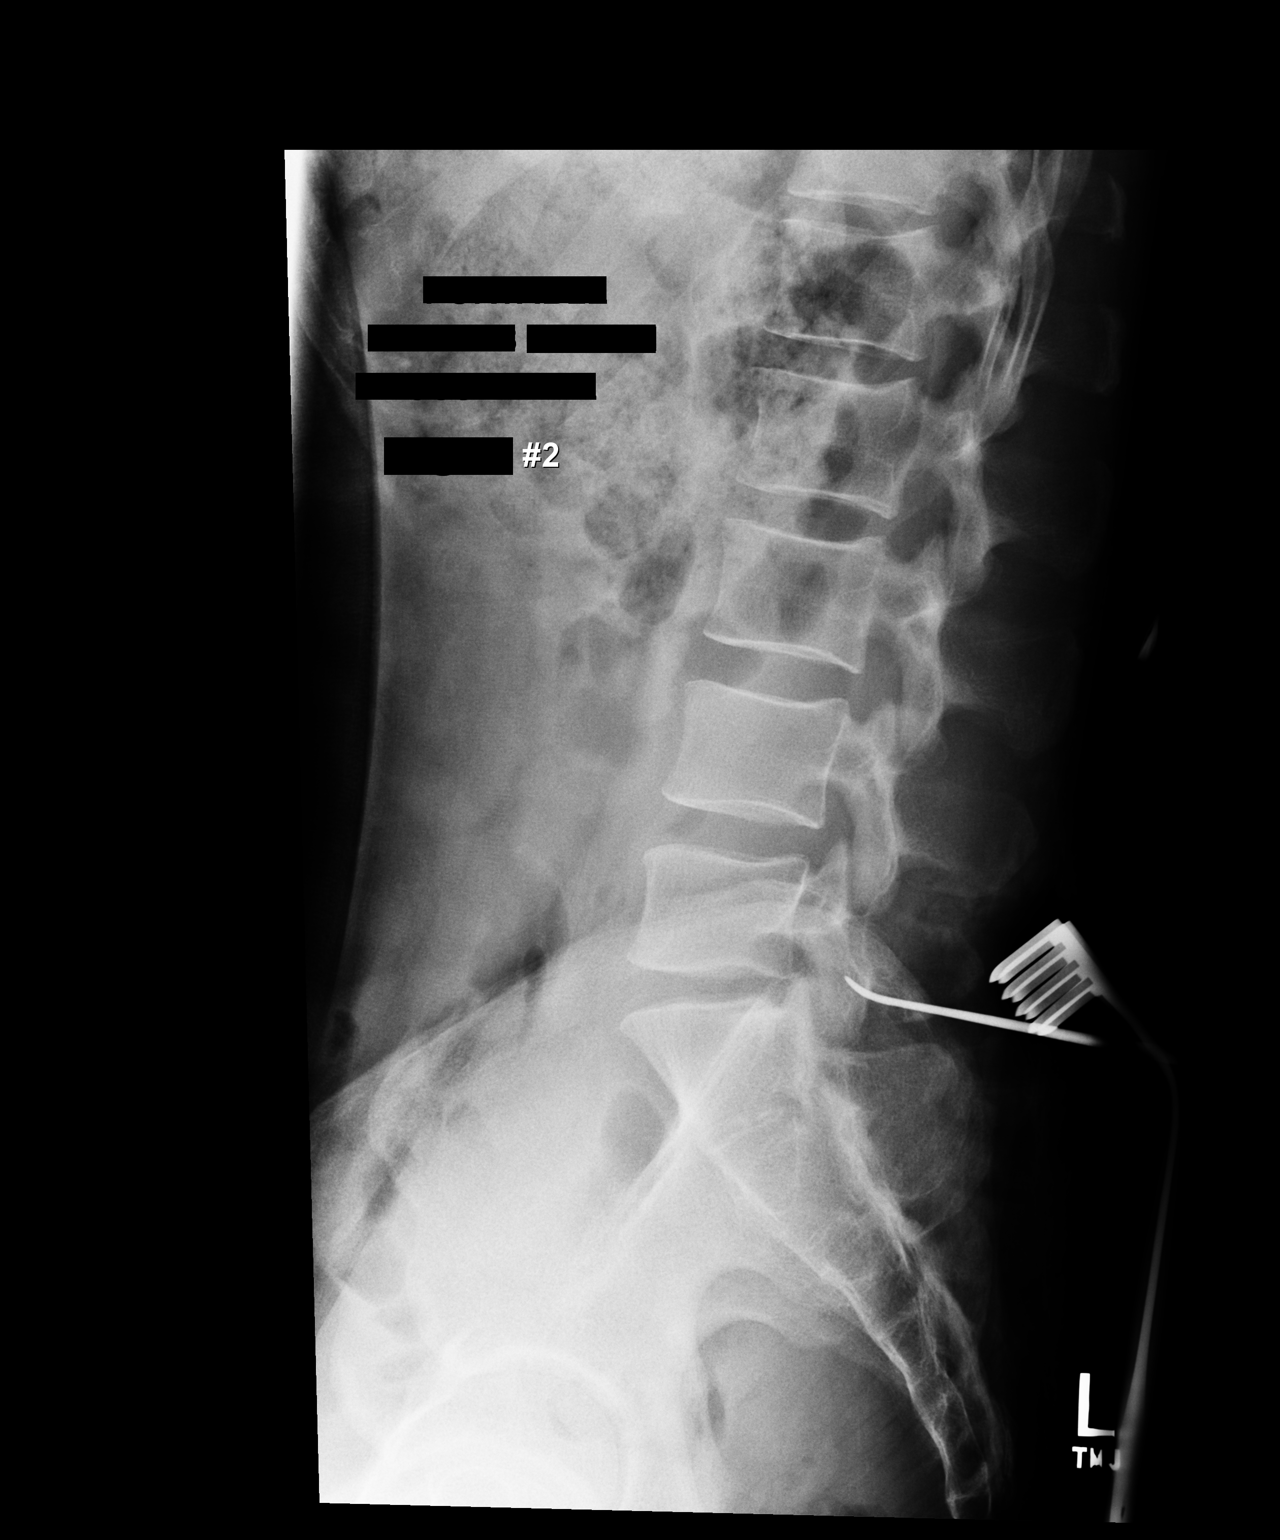

[1 of 1 positions shown; findings below may reference images not displayed]

FINDINGS: Five non-rib-bearing lumbar vertebrae as identified on the prior MRI
examinations.

The localizer device is directed toward the L5-S1 disc space.
IMPRESSION: L5-S1 localized intraoperatively.

## 2021-02-05 ENCOUNTER — Ambulatory Visit: Payer: Self-pay | Admitting: Podiatry

## 2024-11-07 ENCOUNTER — Emergency Department (HOSPITAL_COMMUNITY): Payer: Self-pay

## 2024-11-07 ENCOUNTER — Encounter (HOSPITAL_COMMUNITY): Payer: Self-pay | Admitting: *Deleted

## 2024-11-07 ENCOUNTER — Other Ambulatory Visit: Payer: Self-pay

## 2024-11-07 ENCOUNTER — Emergency Department (HOSPITAL_COMMUNITY)
Admission: EM | Admit: 2024-11-07 | Discharge: 2024-11-07 | Disposition: A | Payer: Self-pay | Attending: Emergency Medicine | Admitting: Emergency Medicine

## 2024-11-07 DIAGNOSIS — R10A1 Flank pain, right side: Secondary | ICD-10-CM | POA: Insufficient documentation

## 2024-11-07 DIAGNOSIS — R319 Hematuria, unspecified: Secondary | ICD-10-CM | POA: Insufficient documentation

## 2024-11-07 LAB — COMPREHENSIVE METABOLIC PANEL WITH GFR
ALT: 39 U/L (ref 0–44)
AST: 26 U/L (ref 15–41)
Albumin: 4.6 g/dL (ref 3.5–5.0)
Alkaline Phosphatase: 51 U/L (ref 38–126)
Anion gap: 12 (ref 5–15)
BUN: 14 mg/dL (ref 6–20)
CO2: 26 mmol/L (ref 22–32)
Calcium: 9.4 mg/dL (ref 8.9–10.3)
Chloride: 103 mmol/L (ref 98–111)
Creatinine, Ser: 1.1 mg/dL (ref 0.61–1.24)
GFR, Estimated: >60 mL/min
Glucose, Bld: 91 mg/dL (ref 70–99)
Potassium: 4 mmol/L (ref 3.5–5.1)
Sodium: 140 mmol/L (ref 135–145)
Total Bilirubin: 0.6 mg/dL (ref 0.0–1.2)
Total Protein: 7.5 g/dL (ref 6.5–8.1)

## 2024-11-07 LAB — URINALYSIS, ROUTINE W REFLEX MICROSCOPIC
Bacteria, UA: NONE SEEN
Bilirubin Urine: NEGATIVE
Glucose, UA: NEGATIVE mg/dL
Ketones, ur: NEGATIVE mg/dL
Leukocytes,Ua: NEGATIVE
Nitrite: NEGATIVE
Protein, ur: NEGATIVE mg/dL
Specific Gravity, Urine: 1.015 (ref 1.005–1.030)
pH: 5 (ref 5.0–8.0)

## 2024-11-07 LAB — CBC
HCT: 51.9 % (ref 39.0–52.0)
Hemoglobin: 17.6 g/dL — ABNORMAL HIGH (ref 13.0–17.0)
MCH: 31.9 pg (ref 26.0–34.0)
MCHC: 33.9 g/dL (ref 30.0–36.0)
MCV: 94 fL (ref 80.0–100.0)
Platelets: 240 K/uL (ref 150–400)
RBC: 5.52 MIL/uL (ref 4.22–5.81)
RDW: 12.7 % (ref 11.5–15.5)
WBC: 7.6 K/uL (ref 4.0–10.5)
nRBC: 0 % (ref 0.0–0.2)

## 2024-11-07 LAB — LIPASE, BLOOD: Lipase: 31 U/L (ref 11–51)

## 2024-11-07 MED ORDER — HYDROCODONE-ACETAMINOPHEN 5-325 MG PO TABS: 2.0000 | ORAL_TABLET | ORAL | 0 refills | Status: AC | PRN

## 2024-11-07 NOTE — ED Triage Notes (Signed)
 Pt c/o right sided abdominal pain x 3 days. Denies n/v/d.

## 2024-11-07 NOTE — ED Provider Notes (Signed)
 " [place] [facility] Provider Note   CSN: [id number] Arrival date & time: 11/07/24  0003     Patient presents with: Abdominal Pain   Mitchell Mccullough is a 49 y.o. male.   The history is provided by the patient and medical records.  Abdominal Pain  50 year old male presenting to the ED with right flank/abdominal pain.  States this has been ongoing for about 5 days now, was more severe, seems to have calmed down a bit now.  He denies any associated nausea, vomiting, or diarrhea.  No fever or chills.  Has still been eating and drinking as normal.  Thought maybe it was a kidney stone, no history of this but does have strong family history.  Has had prior hernia repair as well as vasectomy.  Prior to Admission medications  Medication Sig Start Date End Date Taking? Authorizing Provider  HYDROcodone -acetaminophen  (NORCO/VICODIN) 5-325 MG tablet Take 2 tablets by mouth every 4 (four) hours as needed. 11/07/24  Yes Jarold Olam HERO, PA-C  acetaminophen  (TYLENOL ) 325 MG tablet Take 650 mg by mouth every 6 (six) hours as needed (for pain/headache.).    [provider]  gabapentin  (NEURONTIN ) 300 MG capsule Take 300 mg by mouth 3 (three) times daily as needed (for nerve pain.).    [provider]  methocarbamol  (ROBAXIN ) 500 MG tablet Take 1 tablet (500 mg total) by mouth 3 (three) times daily. 07/23/17   Mayo, Dedra Maus, PA-C  ondansetron  (ZOFRAN  ODT) 4 MG disintegrating tablet Take 1 tablet (4 mg total) by mouth every 8 (eight) hours as needed for nausea or vomiting. 07/23/17   Mayo, Dedra Maus, PA-C  Pseudoeph-Doxylamine-DM-APAP (NYQUIL PO) Take 1 tablet by mouth at bedtime as needed (for sleep).    [provider]    Allergies: Patient has no known allergies.    Review of Systems  Gastrointestinal:  Positive for abdominal pain.  All other systems reviewed and are negative.   Updated Vital Signs BP 107/69 (BP Location:  Right Arm)   Pulse 71   Temp 97.7 F (36.5 C) (Oral)   Resp 18   Ht 6' 2 (1.88 m)   Wt 90.7 kg   SpO2 98%   BMI 25.67 kg/m   Physical Exam Vitals and nursing note reviewed.  Constitutional:      Appearance: He is well-developed.  HENT:     Head: Normocephalic and atraumatic.  Eyes:     Conjunctiva/sclera: Conjunctivae normal.     Pupils: Pupils are equal, round, and reactive to light.  Cardiovascular:     Rate and Rhythm: Normal rate and regular rhythm.     Heart sounds: Normal heart sounds.  Pulmonary:     Effort: Pulmonary effort is normal.     Breath sounds: Normal breath sounds.  Abdominal:     General: Bowel sounds are normal.     Palpations: Abdomen is soft.     Tenderness: There is no abdominal tenderness. There is no guarding or rebound.     Comments: No rash or other skin abnormalities to right flank  Musculoskeletal:        General: Normal range of motion.     Cervical back: Normal range of motion.  Skin:    General: Skin is warm and dry.  Neurological:     Mental Status: He is alert and oriented to person, place, and time.     (all labs ordered are listed, but only abnormal results are displayed)  Labs Reviewed  CBC - Abnormal; Notable for the following components:      Result Value   Hemoglobin 17.6 (*)    All other components within normal limits  URINALYSIS, ROUTINE W REFLEX MICROSCOPIC - Abnormal; Notable for the following components:   Hgb urine dipstick MODERATE (*)    All other components within normal limits  LIPASE, BLOOD  COMPREHENSIVE METABOLIC PANEL WITH GFR    EKG: None  Radiology: CT Renal Stone Study Result Date: 11/07/2024 EXAM: CT ABDOMEN AND PELVIS WITHOUT CONTRAST 11/07/2024 02:43:50 AM TECHNIQUE: CT of the abdomen and pelvis was performed without the administration of intravenous contrast. Multiplanar reformatted images are provided for review. Automated exposure control, iterative reconstruction, and/or weight-based adjustment  of the mA/kV was utilized to reduce the radiation dose to as low as reasonably achievable. COMPARISON: None available. CLINICAL HISTORY: Abdominal/flank pain, stone suspected. FINDINGS: LOWER CHEST: No acute abnormality. LIVER: The liver is unremarkable. GALLBLADDER AND BILE DUCTS: Gallbladder is unremarkable. No biliary ductal dilatation. SPLEEN: No acute abnormality. PANCREAS: No acute abnormality. ADRENAL GLANDS: No acute abnormality. KIDNEYS, URETERS AND BLADDER: No stones in the kidneys or ureters. No hydronephrosis. No perinephric or periureteral stranding. Urinary bladder is unremarkable. GI AND BOWEL: Stomach demonstrates no acute abnormality. There is no bowel obstruction. Mild haziness of jejunal mesentery, without discrete adenopathy, nonspecific. Normal appendix (image 33). PERITONEUM AND RETROPERITONEUM: No ascites. No free air. VASCULATURE: Aorta is normal in caliber. LYMPH NODES: No lymphadenopathy. REPRODUCTIVE ORGANS: The prostate is unremarkable. BONES AND SOFT TISSUES: Very mild degenerative changes at L5-S1. No focal soft tissue abnormality. IMPRESSION: 1. No acute findings in the abdomen or pelvis. Electronically signed by: Pinkie Pebbles MD MD 11/07/2024 02:56 AM EST RP Workstation: [id number]     Procedures   Medications Ordered in the ED - No data to display                                  Medical Decision Making Amount and/or Complexity of Data Reviewed Radiology: ordered and independent interpretation performed. ECG/medicine tests: ordered and independent interpretation performed.  Risk Prescription drug management.   49 y.o. M here with several days of right flank pain.  Was more intense, seems to have calmed down now.  No associated symptoms with this.  He is afebrile and nontoxic in appearance here.  He appears quite comfortable and vitals are stable on room air.  He does not have any reproducible tenderness on exam.  He has no noted rash or overlying skin  abnormality suggestive of shingles.  Labs are grossly reassuring without leukocytosis or electrolyte derangement.  UA with some hematuria.  CT stone study is negative.    We discussed possibility that he may have had a small stone that has already passed versus musculoskeletal origin.  He feels reassured after negative CT scan.  Plan to discharge home with symptomatic management and close PCP follow-up.  Can return here for any new or acute changes.  Final diagnoses:  Right flank pain    ED Discharge Orders          Ordered    HYDROcodone -acetaminophen  (NORCO/VICODIN) 5-325 MG tablet  Every 4 hours PRN        11/07/24 0431               Jarold Olam HERO, PA-C 11/07/24 0504    Trine Raynell Moder, MD 11/07/24 [phone]  "

## 2024-11-07 NOTE — Discharge Instructions (Addendum)
 Labs and CT today were reassuring. Take the prescribed medication as directed.  Do not drive while taking this. Follow-up with your doctor if ongoing issues. Return to the ED for new or worsening symptoms.

## 2024-11-07 NOTE — ED Triage Notes (Signed)
 The pt is c/o  rt sided lower abd pain for 5 days no nausea vomiting no temp no urinary symptoms
